# Patient Record
Sex: Male | Born: 1945 | Race: White | Hispanic: No | Marital: Married | Smoking: Never smoker
Health system: Southern US, Community
[De-identification: ages and names within clinical notes are randomized; demographics above are authoritative.]

## PROBLEM LIST (undated history)

## (undated) DIAGNOSIS — I10 Essential (primary) hypertension: Secondary | ICD-10-CM

## (undated) DIAGNOSIS — D689 Coagulation defect, unspecified: Secondary | ICD-10-CM

## (undated) HISTORY — PX: EYE SURGERY: SHX253

## (undated) HISTORY — DX: Coagulation defect, unspecified: D68.9

## (undated) HISTORY — PX: SPINE SURGERY: SHX786

## (undated) HISTORY — DX: Essential (primary) hypertension: I10

## (undated) HISTORY — PX: HERNIA REPAIR: SHX51

## (undated) HISTORY — PX: FRACTURE SURGERY: SHX138

---

## 2020-04-20 ENCOUNTER — Other Ambulatory Visit: Payer: Self-pay

## 2020-04-20 ENCOUNTER — Ambulatory Visit (INDEPENDENT_AMBULATORY_CARE_PROVIDER_SITE_OTHER): Payer: Medicare Other | Admitting: Internal Medicine

## 2020-04-20 ENCOUNTER — Encounter: Payer: Self-pay | Admitting: Internal Medicine

## 2020-04-20 VITALS — BP 138/78 | HR 67 | Temp 98.4°F | Resp 16 | Ht 61.0 in | Wt 274.1 lb

## 2020-04-20 DIAGNOSIS — Z0000 Encounter for general adult medical examination without abnormal findings: Secondary | ICD-10-CM | POA: Insufficient documentation

## 2020-04-20 DIAGNOSIS — Z7689 Persons encountering health services in other specified circumstances: Secondary | ICD-10-CM | POA: Diagnosis not present

## 2020-04-20 DIAGNOSIS — I4891 Unspecified atrial fibrillation: Secondary | ICD-10-CM | POA: Insufficient documentation

## 2020-04-20 DIAGNOSIS — I10 Essential (primary) hypertension: Secondary | ICD-10-CM | POA: Diagnosis not present

## 2020-04-20 DIAGNOSIS — I340 Nonrheumatic mitral (valve) insufficiency: Secondary | ICD-10-CM | POA: Insufficient documentation

## 2020-04-20 DIAGNOSIS — R7303 Prediabetes: Secondary | ICD-10-CM | POA: Diagnosis not present

## 2020-04-20 DIAGNOSIS — E782 Mixed hyperlipidemia: Secondary | ICD-10-CM

## 2020-04-20 DIAGNOSIS — I480 Paroxysmal atrial fibrillation: Secondary | ICD-10-CM

## 2020-04-20 DIAGNOSIS — E785 Hyperlipidemia, unspecified: Secondary | ICD-10-CM | POA: Insufficient documentation

## 2020-04-20 NOTE — Assessment & Plan Note (Signed)
HbA1C: 5.8 (12/2019) Diet-controlled 

## 2020-04-20 NOTE — Assessment & Plan Note (Signed)
Care established Previous chart reviewed History and medications reviewed with the patient 

## 2020-04-20 NOTE — Assessment & Plan Note (Signed)
Mild according to the previous Cardiologist note review Appears euvolemic On B-blocker for A Fib

## 2020-04-20 NOTE — Assessment & Plan Note (Signed)
Rate-controlled On Sotalol and Eliquis Follows up with Cardiologist Has a loop recorder in place

## 2020-04-20 NOTE — Patient Instructions (Addendum)
Please continue to take medications as prescribed.  Please get fasting blood tests done at least 2 days before the next visit.  Please continue to follow DASH diet and perform moderate exercise/walking as tolerated.  Thank you for choosing [facility]. We consider it our privilege to take care of you!

## 2020-04-20 NOTE — Progress Notes (Signed)
New Patient Office Visit  Subjective:  Patient ID: Jeffery Sanders, male    DOB: [birth year]  Age: 74 y.o. MRN: [id number]  CC:  Chief Complaint  Patient presents with  . New Patient (Initial Visit)    New patient sees dr Saralyn Pilar at [facility] blood work is due     HPI Jeffery Sanders is a 74 year old male with PMH of HTN, paroxysmal A Fib and HLD who presents for establishing care. He moved from [place] recently.  Patient has brought his records from previous provider, which is reviewed.  Patient takes Amlodipine and Triamterene-HCTZ for HTN. BP is well-controlled. Takes medications regularly. Patient denies headache, dizziness, chest pain, dyspnea or palpitations.  Patient has a h/o paroxysmal A Fib, and takes Sotalol and Eliquis. He follows up with Cardiologist at [facility]. Patient has a loop recorder, which was placed in [place]. No h/o cardiac ablation.  Patient has h/o diet-controlled prediabetes.  Last colonoscopy in 2021. Has a family history of colon cancer.  Patient is up-to-date with COVID and flu vaccine. Patient has had PCV13 and PPSV23 (2019).    Past Medical History:  Diagnosis Date  . Clotting disorder ([place])    Phreesia 04/18/2020  . Hypertension    Phreesia 04/18/2020    Past Surgical History:  Procedure Laterality Date  . EYE SURGERY N/A    Phreesia 04/18/2020  . FRACTURE SURGERY N/A    Phreesia 04/18/2020  . HERNIA REPAIR N/A    Phreesia 04/18/2020  . SPINE SURGERY N/A    Phreesia 04/18/2020    History reviewed. No pertinent family history.  Social History   Socioeconomic History  . Marital status: Single    Spouse name: Not on file  . Number of children: Not on file  . Years of education: Not on file  . Highest education level: Not on file  Occupational History  . Not on file  Tobacco Use  . Smoking status: Never Smoker  . Smokeless tobacco: Never Used  Substance and Sexual Activity  . Alcohol use: Never  . Drug use: Never   . Sexual activity: Yes  Other Topics Concern  . Not on file  Social History Narrative  . Not on file   Social Determinants of Health   Financial Resource Strain: Not on file  Food Insecurity: Not on file  Transportation Needs: Not on file  Physical Activity: Not on file  Stress: Not on file  Social Connections: Not on file  Intimate Partner Violence: Not on file    ROS Review of Systems  Constitutional: Negative for chills and fever.  HENT: Negative for congestion and sore throat.   Eyes: Negative for pain and discharge.  Respiratory: Negative for cough and shortness of breath.   Cardiovascular: Negative for chest pain and palpitations.  Gastrointestinal: Negative for constipation, diarrhea, nausea and vomiting.  Endocrine: Negative for polydipsia and polyuria.  Genitourinary: Negative for dysuria and hematuria.  Musculoskeletal: Negative for neck pain and neck stiffness.  Skin: Negative for rash.  Neurological: Negative for dizziness, weakness, numbness and headaches.  Psychiatric/Behavioral: Negative for agitation and behavioral problems.    Objective:   Today's Vitals: BP 138/78 (BP Location: Right Arm, Patient Position: Sitting, Cuff Size: Normal)   Pulse 67   Temp 98.4 F (36.9 C) (Oral)   Resp 16   Ht 5' 1" (1.549 m)   Wt 274 lb 1.9 oz (124.3 kg)   SpO2 94%   BMI 51.79 kg/m   Physical Exam Vitals reviewed.  Constitutional:      General: He is not in acute distress.    Appearance: He is obese. He is not diaphoretic.  HENT:     Head: Normocephalic and atraumatic.     Nose: Nose normal.     Mouth/Throat:     Mouth: Mucous membranes are moist.  Eyes:     General: No scleral icterus.    Extraocular Movements: Extraocular movements intact.     Pupils: Pupils are equal, round, and reactive to light.  Cardiovascular:     Rate and Rhythm: Normal rate and regular rhythm.     Pulses: Normal pulses.     Heart sounds: Normal heart sounds. No murmur  heard.   Pulmonary:     Breath sounds: Normal breath sounds. No wheezing or rales.  Abdominal:     Palpations: Abdomen is soft.     Tenderness: There is no abdominal tenderness.  Musculoskeletal:     Cervical back: Neck supple. No tenderness.     Right lower leg: No edema.     Left lower leg: No edema.  Skin:    General: Skin is warm.     Findings: No rash.  Neurological:     General: No focal deficit present.     Mental Status: He is alert and oriented to person, place, and time.     Sensory: No sensory deficit.     Motor: No weakness.  Psychiatric:        Mood and Affect: Mood normal.        Behavior: Behavior normal.     Assessment & Plan:   Problem List Items Addressed This Visit      Cardiovascular and Mediastinum   Atrial fibrillation ([place])    Rate-controlled On Sotalol and Eliquis Follows up with Cardiologist Has a loop recorder in place      Relevant Medications   amLODipine (NORVASC) 5 MG tablet   sotalol (BETAPACE) 120 MG tablet   apixaban (ELIQUIS) 5 MG TABS tablet   triamterene-hydrochlorothiazide (DYAZIDE) 37.5-25 MG capsule   simvastatin (ZOCOR) 20 MG tablet   Hypertension, essential    BP Readings from Last 1 Encounters:  04/20/20 138/78   Well-controlled with Amlodipine and Dyazide Counseled for compliance with the medications Advised DASH diet and moderate exercise/walking, at least 150 mins/week       Relevant Medications   amLODipine (NORVASC) 5 MG tablet   sotalol (BETAPACE) 120 MG tablet   apixaban (ELIQUIS) 5 MG TABS tablet   triamterene-hydrochlorothiazide (DYAZIDE) 37.5-25 MG capsule   simvastatin (ZOCOR) 20 MG tablet   Other Relevant Orders   TSH + free T4   Mitral valve insufficiency    Mild according to the previous Cardiologist note review Appears euvolemic On B-blocker for A Fib      Relevant Medications   amLODipine (NORVASC) 5 MG tablet   sotalol (BETAPACE) 120 MG tablet   apixaban (ELIQUIS) 5 MG TABS tablet    triamterene-hydrochlorothiazide (DYAZIDE) 37.5-25 MG capsule   simvastatin (ZOCOR) 20 MG tablet     Other   Hyperlipemia    On Simvastatin Check lipid profile before next visit      Relevant Medications   amLODipine (NORVASC) 5 MG tablet   sotalol (BETAPACE) 120 MG tablet   apixaban (ELIQUIS) 5 MG TABS tablet   triamterene-hydrochlorothiazide (DYAZIDE) 37.5-25 MG capsule   simvastatin (ZOCOR) 20 MG tablet   Other Relevant Orders   Lipid Profile   Prediabetes    HbA1C: 5.8 (12/2019) Diet-controlled  Relevant Orders   CMP14+EGFR   HgB A1c   Encounter to establish care - Primary    Care established Previous chart reviewed History and medications reviewed with the patient      Relevant Orders   CBC with Differential   TSH + free T4   Vitamin D (25 hydroxy)   Morbid obesity ([place])   Relevant Medications   magnesium oxide (MAG-OX) 400 MG tablet      Outpatient Encounter Medications as of 04/20/2020  Medication Sig  . amLODipine (NORVASC) 5 MG tablet Take by mouth.  Marland Kitchen apixaban (ELIQUIS) 5 MG TABS tablet Take by mouth.  . Coenzyme Q10 100 MG capsule Take by mouth.  . magnesium oxide (MAG-OX) 400 MG tablet Take by mouth.  . simvastatin (ZOCOR) 20 MG tablet Take by mouth.  . sotalol (BETAPACE) 120 MG tablet Take by mouth.  . triamterene-hydrochlorothiazide (DYAZIDE) 37.5-25 MG capsule Take 1 capsule by mouth every morning.   No facility-administered encounter medications on file as of 04/20/2020.    Follow-up: Return in about 4 weeks (around 05/18/2020) for Annual Physical.   Lindell Spar, MD

## 2020-04-20 NOTE — Assessment & Plan Note (Signed)
BP Readings from Last 1 Encounters:  04/20/20 138/78   Well-controlled with Amlodipine and Dyazide Counseled for compliance with the medications Advised DASH diet and moderate exercise/walking, at least 150 mins/week

## 2020-04-20 NOTE — Assessment & Plan Note (Signed)
On Simvastatin Check lipid profile before next visit 

## 2020-05-13 LAB — CMP14+EGFR
ALT: 9 IU/L (ref 0–44)
AST: 16 IU/L (ref 0–40)
Albumin/Globulin Ratio: 1.5 (ref 1.2–2.2)
Albumin: 4.3 g/dL (ref 3.7–4.7)
Alkaline Phosphatase: 68 IU/L (ref 44–121)
BUN/Creatinine Ratio: 19 (ref 10–24)
BUN: 20 mg/dL (ref 8–27)
Bilirubin Total: 0.6 mg/dL (ref 0.0–1.2)
CO2: 24 mmol/L (ref 20–29)
Calcium: 9.2 mg/dL (ref 8.6–10.2)
Chloride: 104 mmol/L (ref 96–106)
Creatinine, Ser: 1.06 mg/dL (ref 0.76–1.27)
GFR calc Af Amer: 80 mL/min/{1.73_m2} (ref >59)
GFR calc non Af Amer: 69 mL/min/{1.73_m2} (ref >59)
Globulin, Total: 2.8 g/dL (ref 1.5–4.5)
Glucose: 109 mg/dL — ABNORMAL HIGH (ref 65–99)
Potassium: 4 mmol/L (ref 3.5–5.2)
Sodium: 141 mmol/L (ref 134–144)
Total Protein: 7.1 g/dL (ref 6.0–8.5)

## 2020-05-13 LAB — CBC WITH DIFFERENTIAL/PLATELET
Basophils Absolute: 0 10*3/uL (ref 0.0–0.2)
Basos: 1 %
EOS (ABSOLUTE): 0.1 10*3/uL (ref 0.0–0.4)
Eos: 2 %
Hematocrit: 42.9 % (ref 37.5–51.0)
Hemoglobin: 14.5 g/dL (ref 13.0–17.7)
Immature Grans (Abs): 0 10*3/uL (ref 0.0–0.1)
Immature Granulocytes: 0 %
Lymphocytes Absolute: 1.7 10*3/uL (ref 0.7–3.1)
Lymphs: 33 %
MCH: 29.5 pg (ref 26.6–33.0)
MCHC: 33.8 g/dL (ref 31.5–35.7)
MCV: 87 fL (ref 79–97)
Monocytes Absolute: 0.7 10*3/uL (ref 0.1–0.9)
Monocytes: 14 %
Neutrophils Absolute: 2.6 10*3/uL (ref 1.4–7.0)
Neutrophils: 50 %
Platelets: 186 10*3/uL (ref 150–450)
RBC: 4.92 x10E6/uL (ref 4.14–5.80)
RDW: 13.5 % (ref 11.6–15.4)
WBC: 5.1 10*3/uL (ref 3.4–10.8)

## 2020-05-13 LAB — LIPID PANEL
Chol/HDL Ratio: 3.4 ratio (ref 0.0–5.0)
Cholesterol, Total: 129 mg/dL (ref 100–199)
HDL: 38 mg/dL — ABNORMAL LOW (ref >39)
LDL Chol Calc (NIH): 68 mg/dL (ref 0–99)
Triglycerides: 127 mg/dL (ref 0–149)
VLDL Cholesterol Cal: 23 mg/dL (ref 5–40)

## 2020-05-13 LAB — HEMOGLOBIN A1C
Est. average glucose Bld gHb Est-mCnc: 126 mg/dL
Hgb A1c MFr Bld: 6 % — ABNORMAL HIGH (ref 4.8–5.6)

## 2020-05-13 LAB — TSH+FREE T4
Free T4: 1.1 ng/dL (ref 0.82–1.77)
TSH: 4.09 u[IU]/mL (ref 0.450–4.500)

## 2020-05-13 LAB — VITAMIN D 25 HYDROXY (VIT D DEFICIENCY, FRACTURES): Vit D, 25-Hydroxy: 27.8 ng/mL — ABNORMAL LOW (ref 30.0–100.0)

## 2020-05-18 ENCOUNTER — Other Ambulatory Visit: Payer: Self-pay

## 2020-05-18 ENCOUNTER — Ambulatory Visit (INDEPENDENT_AMBULATORY_CARE_PROVIDER_SITE_OTHER): Payer: Medicare Other | Admitting: Internal Medicine

## 2020-05-18 ENCOUNTER — Encounter: Payer: Self-pay | Admitting: Internal Medicine

## 2020-05-18 VITALS — BP 125/74 | HR 62 | Temp 97.8°F | Ht 72.0 in | Wt 278.0 lb

## 2020-05-18 DIAGNOSIS — E559 Vitamin D deficiency, unspecified: Secondary | ICD-10-CM | POA: Diagnosis not present

## 2020-05-18 DIAGNOSIS — Z135 Encounter for screening for eye and ear disorders: Secondary | ICD-10-CM | POA: Diagnosis not present

## 2020-05-18 DIAGNOSIS — Z0000 Encounter for general adult medical examination without abnormal findings: Secondary | ICD-10-CM | POA: Diagnosis not present

## 2020-05-18 DIAGNOSIS — L309 Dermatitis, unspecified: Secondary | ICD-10-CM | POA: Diagnosis not present

## 2020-05-18 DIAGNOSIS — I10 Essential (primary) hypertension: Secondary | ICD-10-CM | POA: Diagnosis not present

## 2020-05-18 MED ORDER — TRIAMCINOLONE ACETONIDE 0.1 % EX CREA: 1.0000 "application " | TOPICAL_CREAM | Freq: Two times a day (BID) | CUTANEOUS | 0 refills | Status: DC

## 2020-05-18 NOTE — Assessment & Plan Note (Signed)
Annual exam as documented. Counseling done  re healthy lifestyle involving commitment to 150 minutes exercise per week, heart healthy diet, and attaining healthy weight.The importance of adequate sleep also discussed. Changes in health habits are decided on by the patient with goals and time frames  set for achieving them. Immunization and cancer screening needs are specifically addressed at this visit. 

## 2020-05-18 NOTE — Assessment & Plan Note (Signed)
Advised to take Vitamin 1000 IU QD

## 2020-05-18 NOTE — Assessment & Plan Note (Signed)
Dry skin over palms with patches of erythema Kenalog cream prescribed Advised to apply lotion/moisturizer

## 2020-05-18 NOTE — Patient Instructions (Signed)
Please continue to take medications as prescribed.  Please start taking Vitamin D 1000 IU once in a day.  Please continue to check BP at home and contact us if BP is consistently more than 140/90.  Please continue to follow heart healthy diet and perform moderate exercise/walking at least 150 mins/week.

## 2020-05-18 NOTE — Progress Notes (Addendum)
Established Patient Office Visit  Subjective:  Patient ID: Jeffery Sanders, male    DOB: 1945-09-30  Age: 75 y.o. MRN: [id number]  CC:  Chief Complaint  Patient presents with  . Annual Exam    HPI Jeffery Sanders is a 75 year old male with PMH of HTN, paroxysmal A Fib and HLD who presents for follow up of his chronic medical conditions and c/o scaling over his palm.  He states that he has been in good health overall. His blood tests were reviewed and discussed with him in detail.  He c/o b/l palm area dryness and scaling. He was told of eczema in the past and used to use a cream for it. He also c/o dry skin over the soles. Denies any known environmental allergies.  He feels gloomy during winter season and cloudy days. He denies any anhedonia, anxiety, change in appetite or weight.  Past Medical History:  Diagnosis Date  . Clotting disorder ([facility])    Phreesia 04/18/2020  . Hypertension    Phreesia 04/18/2020    Past Surgical History:  Procedure Laterality Date  . EYE SURGERY N/A    Phreesia 04/18/2020  . FRACTURE SURGERY N/A    Phreesia 04/18/2020  . HERNIA REPAIR N/A    Phreesia 04/18/2020  . SPINE SURGERY N/A    Phreesia 04/18/2020    History reviewed. No pertinent family history.  Social History   Socioeconomic History  . Marital status: Single    Spouse name: Not on file  . Number of children: Not on file  . Years of education: Not on file  . Highest education level: Not on file  Occupational History  . Not on file  Tobacco Use  . Smoking status: Never Smoker  . Smokeless tobacco: Never Used  Substance and Sexual Activity  . Alcohol use: Never  . Drug use: Never  . Sexual activity: Yes  Other Topics Concern  . Not on file  Social History Narrative  . Not on file   Social Determinants of Health   Financial Resource Strain: Not on file  Food Insecurity: Not on file  Transportation Needs: Not on file  Physical Activity: Not on file  Stress: Not on file   Social Connections: Not on file  Intimate Partner Violence: Not on file    Outpatient Medications Prior to Visit  Medication Sig Dispense Refill  . amLODipine (NORVASC) 5 MG tablet Take by mouth.    Marland Kitchen apixaban (ELIQUIS) 5 MG TABS tablet Take by mouth.    . Coenzyme Q10 100 MG capsule Take by mouth.    . magnesium oxide (MAG-OX) 400 MG tablet Take by mouth.    . simvastatin (ZOCOR) 20 MG tablet Take by mouth.    . sotalol (BETAPACE) 120 MG tablet Take by mouth.    . triamterene-hydrochlorothiazide (DYAZIDE) 37.5-25 MG capsule Take 1 capsule by mouth every morning.     No facility-administered medications prior to visit.    Not on File  ROS Review of Systems  Constitutional: Negative for chills and fever.  HENT: Negative for congestion and sore throat.   Eyes: Negative for pain and discharge.  Respiratory: Negative for cough and shortness of breath.   Cardiovascular: Negative for chest pain and palpitations.  Gastrointestinal: Negative for constipation, diarrhea, nausea and vomiting.  Endocrine: Negative for polydipsia and polyuria.  Genitourinary: Negative for dysuria and hematuria.  Musculoskeletal: Negative for neck pain and neck stiffness.  Skin: Positive for rash.  Neurological: Negative for dizziness, weakness, numbness and  headaches.  Psychiatric/Behavioral: Negative for agitation and behavioral problems.      Objective:    Physical Exam Vitals reviewed.  Constitutional:      General: He is not in acute distress.    Appearance: He is obese. He is not diaphoretic.  HENT:     Head: Normocephalic and atraumatic.     Nose: Nose normal.     Mouth/Throat:     Mouth: Mucous membranes are moist.  Eyes:     General: No scleral icterus.    Extraocular Movements: Extraocular movements intact.     Pupils: Pupils are equal, round, and reactive to light.  Cardiovascular:     Rate and Rhythm: Normal rate and regular rhythm.     Pulses: Normal pulses.     Heart sounds:  Normal heart sounds. No murmur heard.   Pulmonary:     Breath sounds: Normal breath sounds. No wheezing or rales.  Abdominal:     Palpations: Abdomen is soft.     Tenderness: There is no abdominal tenderness.  Musculoskeletal:     Cervical back: Neck supple. No tenderness.     Right lower leg: No edema.     Left lower leg: No edema.  Skin:    General: Skin is warm.     Comments: Scaly patches and erythema over b/l hands Plaque over scalp, right occipital area, no drainage or color/border irregularity  Neurological:     General: No focal deficit present.     Mental Status: He is alert and oriented to person, place, and time.     Sensory: No sensory deficit.     Motor: No weakness.  Psychiatric:        Mood and Affect: Mood normal.        Behavior: Behavior normal.     BP 125/74 (BP Location: Left Arm, Patient Position: Sitting)   Pulse 62   Temp 97.8 F (36.6 C) (Oral)   Ht 6' (1.829 m)   Wt 278 lb (126.1 kg)   SpO2 96%   BMI 37.70 kg/m  Wt Readings from Last 3 Encounters:  05/18/20 278 lb (126.1 kg)  04/20/20 274 lb 1.9 oz (124.3 kg)     Health Maintenance Due  Topic Date Due  . Hepatitis C Screening  Never done  . TETANUS/TDAP  Never done    There are no preventive care reminders to display for this patient.  Lab Results  Component Value Date   TSH 4.090 05/12/2020   Lab Results  Component Value Date   WBC 5.1 05/12/2020   HGB 14.5 05/12/2020   HCT 42.9 05/12/2020   MCV 87 05/12/2020   PLT 186 05/12/2020   Lab Results  Component Value Date   NA 141 05/12/2020   K 4.0 05/12/2020   CO2 24 05/12/2020   GLUCOSE 109 (H) 05/12/2020   BUN 20 05/12/2020   CREATININE 1.06 05/12/2020   BILITOT 0.6 05/12/2020   ALKPHOS 68 05/12/2020   AST 16 05/12/2020   ALT 9 05/12/2020   PROT 7.1 05/12/2020   ALBUMIN 4.3 05/12/2020   CALCIUM 9.2 05/12/2020   Lab Results  Component Value Date   CHOL 129 05/12/2020   Lab Results  Component Value Date   HDL 38  (L) 05/12/2020   Lab Results  Component Value Date   LDLCALC 68 05/12/2020   Lab Results  Component Value Date   TRIG 127 05/12/2020   Lab Results  Component Value Date   CHOLHDL 3.4 05/12/2020  Lab Results  Component Value Date   HGBA1C 6.0 (H) 05/12/2020      Assessment & Plan:   Problem List Items Addressed This Visit      Musculoskeletal and Integument  Eczema   Dry skin over palms with patches of erythema Kenalog cream prescribed Advised to apply lotion/moisturizer     Relevant Medications  triamcinolone (KENALOG) 0.1 %  HTN BP Readings from Last 1 Encounters:  05/18/20 125/74   Well-controlled Counseled for compliance with the medications Advised DASH diet and moderate exercise/walking, at least 150 mins/week    Vitamin D deficiency    Advised to take Vitamin 1000 IU QD         Meds ordered this encounter  Medications  . triamcinolone (KENALOG) 0.1 %    Sig: Apply 1 application topically 2 (two) times daily.    Dispense:  30 g    Refill:  0    Follow-up: Return in about 4 months (around 09/15/2020).    Anabel Halon, MD

## 2020-05-31 ENCOUNTER — Other Ambulatory Visit: Payer: Self-pay | Admitting: Internal Medicine

## 2020-05-31 DIAGNOSIS — L309 Dermatitis, unspecified: Secondary | ICD-10-CM

## 2020-06-05 ENCOUNTER — Telehealth: Payer: Self-pay

## 2020-06-05 NOTE — Telephone Encounter (Signed)
Patient was told by Dr Marguerita Beards office that he would be better off seeing a optometrist vs a ophthalmologist can you have Dr Allena Katz put in a new referral and I will send it to the correct place ? Phone [phone]

## 2020-06-15 ENCOUNTER — Other Ambulatory Visit: Payer: Self-pay | Admitting: *Deleted

## 2020-06-15 DIAGNOSIS — R7303 Prediabetes: Secondary | ICD-10-CM

## 2020-06-15 DIAGNOSIS — Z0100 Encounter for examination of eyes and vision without abnormal findings: Secondary | ICD-10-CM

## 2020-06-15 NOTE — Progress Notes (Signed)
amb ref °

## 2020-06-19 ENCOUNTER — Telehealth: Payer: Self-pay

## 2020-06-19 NOTE — Telephone Encounter (Signed)
I thought he had asked for annual physical and that is what we had scheduled. I am okay with changing it to routine follow up code - [id number].

## 2020-06-19 NOTE — Telephone Encounter (Signed)
Patients girlfriend called and said the insurance company told them the service on 04/30/20 should be billed as a AWV and that is why it was not paid.  The patient is asking you to edit and change the code if this was or should have been a AWV. Please let me know if you are able to change the coding and I will have billing re bill it if the visit was a AWV and it was just coded incorrect.  I will call the patient back when we confirm the billing.

## 2020-06-19 NOTE — Addendum Note (Signed)
Addended byTrena Platt on: 06/19/2020 05:00 PM   Modules accepted: Level of Service

## 2020-06-20 ENCOUNTER — Ambulatory Visit (HOSPITAL_COMMUNITY)
Admission: RE | Admit: 2020-06-20 | Discharge: 2020-06-20 | Disposition: A | Discharge status: Home / Self Care | Payer: Medicare Other | Source: Ambulatory Visit | Attending: Internal Medicine | Admitting: Internal Medicine

## 2020-06-20 ENCOUNTER — Encounter: Payer: Self-pay | Admitting: Internal Medicine

## 2020-06-20 ENCOUNTER — Telehealth: Payer: Self-pay

## 2020-06-20 ENCOUNTER — Ambulatory Visit (INDEPENDENT_AMBULATORY_CARE_PROVIDER_SITE_OTHER): Payer: Medicare Other | Admitting: Internal Medicine

## 2020-06-20 ENCOUNTER — Other Ambulatory Visit: Payer: Self-pay

## 2020-06-20 VITALS — BP 135/76 | HR 62 | Temp 97.6°F | Ht 72.0 in | Wt 275.0 lb

## 2020-06-20 DIAGNOSIS — Z95818 Presence of other cardiac implants and grafts: Secondary | ICD-10-CM | POA: Insufficient documentation

## 2020-06-20 DIAGNOSIS — R0781 Pleurodynia: Secondary | ICD-10-CM | POA: Insufficient documentation

## 2020-06-20 DIAGNOSIS — W19XXXA Unspecified fall, initial encounter: Secondary | ICD-10-CM | POA: Insufficient documentation

## 2020-06-20 DIAGNOSIS — J8489 Other specified interstitial pulmonary diseases: Secondary | ICD-10-CM | POA: Diagnosis not present

## 2020-06-20 NOTE — Telephone Encounter (Signed)
Dr Allena Katz corrected the medical record to reflect 13887 for DOS 05/18/20.  I have emailed Melissa Noon and asked for this claim to rebill if possible.  I called patient and let them know we were asking for a refilled claim.

## 2020-06-20 NOTE — Progress Notes (Signed)
Acute Office Visit  Subjective:    Patient ID: Jeffery Sanders, male    DOB: [birth year], 75 y.o.   MRN: [id number]  Chief Complaint  Patient presents with  . Genia Hotter into a bench outside yesterday, hurt his R abdomen/rib area. Hurts to take a deep breath, cough, sneeze, walk.    HPI Patient is in today for evaluation after a mechanical fall yesterday. He was walking on sidewalk and tripped over a pine cone. He tried to grab a bench, but fell over his right side and hit his right rib cage. He is having constant dull ache of right chest wall, but there is no bruising noted. He tried heating pads and Tylenol, which have helped. Pain is worse with deep breathing and cough. Of note, he is on Eliquis for A Fib.  Past Medical History:  Diagnosis Date  . Clotting disorder (HCC)    [facility] 04/18/2020  . Hypertension    [facility] 04/18/2020    Past Surgical History:  Procedure Laterality Date  . EYE SURGERY N/A    [facility] 04/18/2020  . FRACTURE SURGERY N/A    [facility] 04/18/2020  . HERNIA REPAIR N/A    [facility] 04/18/2020  . SPINE SURGERY N/A    [facility] 04/18/2020    History reviewed. No pertinent family history.  Social History   Socioeconomic History  . Marital status: Single    Spouse name: Not on file  . Number of children: Not on file  . Years of education: Not on file  . Highest education level: Not on file  Occupational History  . Not on file  Tobacco Use  . Smoking status: Never Smoker  . Smokeless tobacco: Never Used  Substance and Sexual Activity  . Alcohol use: Never  . Drug use: Never  . Sexual activity: Yes  Other Topics Concern  . Not on file  Social History Narrative  . Not on file   Social Determinants of Health   Financial Resource Strain: Not on file  Food Insecurity: Not on file  Transportation Needs: Not on file  Physical Activity: Not on file  Stress: Not on file  Social Connections: Not on file  Intimate Partner Violence: Not on file     Outpatient Medications Prior to Visit  Medication Sig Dispense Refill  . amLODipine (NORVASC) 5 MG tablet Take by mouth.    Marland Kitchen apixaban (ELIQUIS) 5 MG TABS tablet Take by mouth.    . Coenzyme Q10 100 MG capsule Take by mouth.    . magnesium oxide (MAG-OX) 400 MG tablet Take by mouth.    . simvastatin (ZOCOR) 20 MG tablet Take by mouth.    . sotalol (BETAPACE) 120 MG tablet Take by mouth.    . triamcinolone (KENALOG) 0.1 % APPLY TOPICALLY TO THE AFFECTED AREA TWICE DAILY 30 g 0  . triamterene-hydrochlorothiazide (DYAZIDE) 37.5-25 MG capsule Take 1 capsule by mouth every morning.     No facility-administered medications prior to visit.    Not on File  Review of Systems  Constitutional: Negative for chills and fever.  HENT: Negative for congestion and sore throat.   Eyes: Negative for pain and discharge.  Respiratory: Negative for cough and shortness of breath.   Cardiovascular: Positive for chest pain. Negative for palpitations.  Gastrointestinal: Negative for constipation, diarrhea, nausea and vomiting.  Endocrine: Negative for polydipsia and polyuria.  Genitourinary: Negative for dysuria and hematuria.  Musculoskeletal: Negative for neck pain and neck stiffness.  Skin: Negative for  rash.  Neurological: Negative for dizziness, weakness, numbness and headaches.  Psychiatric/Behavioral: Negative for agitation and behavioral problems.       Objective:    Physical Exam Vitals reviewed.  Constitutional:      General: He is not in acute distress.    Appearance: He is obese. He is not diaphoretic.  HENT:     Head: Normocephalic and atraumatic.     Nose: Nose normal.     Mouth/Throat:     Mouth: Mucous membranes are moist.  Eyes:     General: No scleral icterus.    Extraocular Movements: Extraocular movements intact.     Pupils: Pupils are equal, round, and reactive to light.  Cardiovascular:     Rate and Rhythm: Normal rate and regular rhythm.     Pulses: Normal pulses.      Heart sounds: Normal heart sounds. No murmur heard.   Pulmonary:     Breath sounds: Normal breath sounds. No wheezing or rales.  Abdominal:     Palpations: Abdomen is soft.     Tenderness: There is no abdominal tenderness.  Musculoskeletal:     Cervical back: Neck supple. No tenderness.     Right lower leg: No edema.     Left lower leg: No edema.  Skin:    General: Skin is warm.     Comments: Scaly patches and erythema over b/l hands Plaque over scalp, right occipital area, no drainage or color/border irregularity  Neurological:     General: No focal deficit present.     Mental Status: He is alert and oriented to person, place, and time.     Sensory: No sensory deficit.     Motor: No weakness.  Psychiatric:        Mood and Affect: Mood normal.        Behavior: Behavior normal.     BP 135/76 (BP Location: Left Arm, Patient Position: Sitting, Cuff Size: Normal)   Pulse 62   Temp 97.6 F (36.4 C) (Temporal)   Ht 6' (1.829 m)   Wt 275 lb (124.7 kg)   SpO2 93%   BMI 37.30 kg/m  Wt Readings from Last 3 Encounters:  06/20/20 275 lb (124.7 kg)  05/18/20 278 lb (126.1 kg)  04/20/20 274 lb 1.9 oz (124.3 kg)    Health Maintenance Due  Topic Date Due  . Hepatitis C Screening  Never done  . TETANUS/TDAP  Never done    There are no preventive care reminders to display for this patient.   Lab Results  Component Value Date   TSH 4.090 05/12/2020   Lab Results  Component Value Date   WBC 5.1 05/12/2020   HGB 14.5 05/12/2020   HCT 42.9 05/12/2020   MCV 87 05/12/2020   PLT 186 05/12/2020   Lab Results  Component Value Date   NA 141 05/12/2020   K 4.0 05/12/2020   CO2 24 05/12/2020   GLUCOSE 109 (H) 05/12/2020   BUN 20 05/12/2020   CREATININE 1.06 05/12/2020   BILITOT 0.6 05/12/2020   ALKPHOS 68 05/12/2020   AST 16 05/12/2020   ALT 9 05/12/2020   PROT 7.1 05/12/2020   ALBUMIN 4.3 05/12/2020   CALCIUM 9.2 05/12/2020   Lab Results  Component Value Date    CHOL 129 05/12/2020   Lab Results  Component Value Date   HDL 38 (L) 05/12/2020   Lab Results  Component Value Date   LDLCALC 68 05/12/2020   Lab Results  Component Value  Date   TRIG 127 05/12/2020   Lab Results  Component Value Date   CHOLHDL 3.4 05/12/2020   Lab Results  Component Value Date   HGBA1C 6.0 (H) 05/12/2020       Assessment & Plan:   Problem List Items Addressed This Visit   None   Visit Diagnoses    Fall, initial encounter    -  Primary Mechanical fall Check X-ray ribs to r/o rib fracture   Relevant Orders   DG Ribs Unilateral Right   Rib pain on right side     Tylenol PRN Heating pads Deep breathing as tolerated   Relevant Orders   DG Ribs Unilateral Right       No orders of the defined types were placed in this encounter.    Anabel Halon, MD

## 2020-06-20 NOTE — Patient Instructions (Signed)
Chest Wall Pain Chest wall pain is pain in or around the bones and muscles of your chest. Chest wall pain may be caused by:  An injury.  Coughing a lot.  Using your chest and arm muscles too much. Sometimes, the cause may not be known. This pain may take a few weeks or longer to get better. Follow these instructions at home: Managing pain, stiffness, and swelling If told, put ice on the painful area:  Put ice in a plastic bag.  Place a towel between your skin and the bag.  Leave the ice on for 20 minutes, 2-3 times a day.   Activity  Rest as told by your doctor.  Avoid doing things that cause pain. This includes lifting heavy items.  Ask your doctor what activities are safe for you. General instructions  Take over-the-counter and prescription medicines only as told by your doctor.  Do not use any products that contain nicotine or tobacco, such as cigarettes, e-cigarettes, and chewing tobacco. If you need help quitting, ask your doctor.  Keep all follow-up visits as told by your doctor. This is important.   Contact a doctor if:  You have a fever.  Your chest pain gets worse.  You have new symptoms. Get help right away if:  You feel sick to your stomach (nauseous) or you throw up (vomit).  You feel sweaty or light-headed.  You have a cough with mucus from your lungs (sputum) or you cough up blood.  You are Tesch of breath. These symptoms may be an emergency. Do not wait to see if the symptoms will go away. Get medical help right away. Call your local emergency services (911 in the [place] Do not drive yourself to the hospital. Summary  Chest wall pain is pain in or around the bones and muscles of your chest.  It may be treated with ice, rest, and medicines. Your condition may also get better if you avoid doing things that cause pain.  Contact a doctor if you have a fever, chest pain that gets worse, or new symptoms.  Get help right away if you feel light-headed  or you get Ridlon of breath. These symptoms may be an emergency. This information is not intended to replace advice given to you by your health care provider. Make sure you discuss any questions you have with your health care provider. Document Revised: 10/16/2017 Document Reviewed: 10/16/2017 Elsevier Patient Education  2021 Elsevier Inc.  

## 2020-06-26 ENCOUNTER — Other Ambulatory Visit: Payer: Self-pay | Admitting: Internal Medicine

## 2020-06-26 DIAGNOSIS — L309 Dermatitis, unspecified: Secondary | ICD-10-CM

## 2020-08-15 NOTE — Progress Notes (Signed)
Subjective:   Jeffery Sanders is a 75 y.o. male who presents for Medicare Annual/Subsequent preventive examination.  I connected with [name]  today by telephone and verified that I am speaking with the correct person using two identifiers. Location patient: home Location provider: work Persons participating in the virtual visit: patient, provider.   I discussed the limitations, risks, security and privacy concerns of performing an evaluation and management service by telephone and the availability of in person appointments. I also discussed with the patient that there may be a patient responsible charge related to this service. The patient expressed understanding and verbally consented to this telephonic visit.    Interactive audio and video telecommunications were attempted between this provider and patient, however failed, due to patient having technical difficulties OR patient did not have access to video capability.  We continued and completed visit with audio only.      Review of Systems    N/A  Cardiac Risk Factors include: advanced age (>66men, >31 women);male gender;hypertension;dyslipidemia;smoking/ tobacco exposure     Objective:    Today's Vitals   There is no height or weight on file to calculate BMI.  Advanced Directives 08/16/2020  Does Patient Have a Medical Advance Directive? No  Would patient like information on creating a medical advance directive? No - Patient declined    Current Medications (verified) Outpatient Encounter Medications as of 08/16/2020  Medication Sig  . amLODipine (NORVASC) 5 MG tablet Take by mouth.  Marland Kitchen apixaban (ELIQUIS) 5 MG TABS tablet Take by mouth.  . Coenzyme Q10 100 MG capsule Take by mouth.  . magnesium oxide (MAG-OX) 400 MG tablet Take by mouth.  . simvastatin (ZOCOR) 20 MG tablet Take by mouth.  . sotalol (BETAPACE) 120 MG tablet Take by mouth.  . triamcinolone (KENALOG) 0.1 % APPLY TOPICALLY TO THE AFFECTED AREA TWICE DAILY   . triamterene-hydrochlorothiazide (DYAZIDE) 37.5-25 MG capsule Take 1 capsule by mouth every morning.   No facility-administered encounter medications on file as of 08/16/2020.    Allergies (verified) Patient has no known allergies.   History: Past Medical History:  Diagnosis Date  . Clotting disorder (HCC)    [facility] 04/18/2020  . Hypertension    [facility] 04/18/2020   Past Surgical History:  Procedure Laterality Date  . EYE SURGERY N/A    [facility] 04/18/2020  . FRACTURE SURGERY N/A    [facility] 04/18/2020  . HERNIA REPAIR N/A    [facility] 04/18/2020  . SPINE SURGERY N/A    [facility] 04/18/2020   History reviewed. No pertinent family history. Social History   Socioeconomic History  . Marital status: Single    Spouse name: Not on file  . Number of children: Not on file  . Years of education: Not on file  . Highest education level: Not on file  Occupational History  . Not on file  Tobacco Use  . Smoking status: Never Smoker  . Smokeless tobacco: Never Used  Substance and Sexual Activity  . Alcohol use: Never  . Drug use: Never  . Sexual activity: Yes  Other Topics Concern  . Not on file  Social History Narrative  . Not on file   Social Determinants of Health   Financial Resource Strain: Low Risk   . Difficulty of Paying Living Expenses: Not hard at all  Food Insecurity: No Food Insecurity  . Worried About Programme researcher, broadcasting/film/video in the Last Year: Never true  . Ran Out of Food in the Last Year: Never true  Transportation Needs: No Transportation Needs  . Lack of Transportation (Medical): No  . Lack of Transportation (Non-Medical): No  Physical Activity: Insufficiently Active  . Days of Exercise per Week: 7 days  . Minutes of Exercise per Session: 20 min  Stress: No Stress Concern Present  . Feeling of Stress : Not at all  Social Connections: Moderately Isolated  . Frequency of Communication with Friends and Family: Once a week  . Frequency of Social  Gatherings with Friends and Family: Once a week  . Attends Religious Services: 1 to 4 times per year  . Active Member of Clubs or Organizations: No  . Attends Banker Meetings: Never  . Marital Status: Living with partner    Tobacco Counseling Counseling given: Not Answered   Clinical Intake:  Pre-visit preparation completed: Yes  Pain : No/denies pain     Nutritional Risks: None Diabetes: No  How often do you need to have someone help you when you read instructions, pamphlets, or other written materials from your doctor or pharmacy?: 2 - Rarely  Diabetic?No   Interpreter Needed?: No  Information entered by :: SCrews,LPN   Activities of Daily Living In your present state of health, do you have any difficulty performing the following activities: 08/16/2020 04/20/2020  Hearing? Y N  Comment has some difficulty hearing to right ear. Has hx of mastoid surgery -  Vision? N N  Difficulty concentrating or making decisions? N N  Walking or climbing stairs? N N  Dressing or bathing? N N  Doing errands, shopping? N N  Preparing Food and eating ? N -  Using the Toilet? N -  In the past six months, have you accidently leaked urine? N -  Do you have problems with loss of bowel control? N -  Managing your Medications? N -  Managing your Finances? N -  Housekeeping or managing your Housekeeping? N -  Some recent data might be hidden    Patient Care Team: Anabel Halon, MD as PCP - General (Internal Medicine)  Indicate any recent Medical Services you may have received from other than [facility] providers in the past year (date may be approximate).     Assessment:   This is a routine wellness examination for [name].  Hearing/Vision screen  Hearing Screening   125Hz  250Hz  500Hz  1000Hz  2000Hz  3000Hz  4000Hz  6000Hz  8000Hz   Right ear:           Left ear:           Vision Screening Comments: Gets eye examined once per year. Has upcoming appointment in June. Has hx of  bilateral cataract surgery. Currently wears reading glasses.   Dietary issues and exercise activities discussed: Current Exercise Habits: Home exercise routine, Type of exercise: walking, Time (Minutes): 25, Frequency (Times/Week): 7, Weekly Exercise (Minutes/Week): 175, Intensity: Mild, Exercise limited by: None identified  Goals    . Weight (lb) < 225 lb (102.1 kg)      Depression Screen PHQ 2/9 Scores 08/16/2020 06/20/2020 05/18/2020 04/20/2020  PHQ - 2 Score 0 0 0 0    Fall Risk Fall Risk  08/16/2020 06/20/2020 05/18/2020 04/20/2020  Falls in the past year? 1 1 0 0  Number falls in past yr: 0 0 0 0  Injury with Fall? 1 1 0 0  Risk for fall due to : History of fall(s) Impaired balance/gait No Fall Risks Impaired balance/gait  Follow up Falls evaluation completed;Falls prevention discussed Falls evaluation completed Falls evaluation completed Falls evaluation  completed    FALL RISK PREVENTION PERTAINING TO THE HOME:  Any stairs in or around the home? Yes  If so, are there any without handrails? No  Home free of loose throw rugs in walkways, pet beds, electrical cords, etc? Yes  Adequate lighting in your home to reduce risk of falls? Yes   ASSISTIVE DEVICES UTILIZED TO PREVENT FALLS:  Life alert? No  Use of a cane, walker or w/c? No  Grab bars in the bathroom? No  Shower chair or bench in shower? Yes  Elevated toilet seat or a handicapped toilet? No     Cognitive Function:     Normal cognitive status assessed by direct observation by this Nurse Health Advisor. No abnormalities found.      Immunizations Immunization History  Administered Date(s) Administered  . Influenza, Seasonal, Injecte, Preservative Fre 03/22/2015  . Influenza,inj,Quad PF,6+ Mos 02/26/2016, 01/27/2017  . Influenza-Unspecified 03/13/2001, 03/05/2004, 02/25/2005, 03/05/2007, 02/11/2008, 02/10/2009, 01/27/2010, 01/28/2011, 01/28/2012, 02/13/2013, 03/29/2013, 01/27/2020  . Moderna Sars-Covid-2 Vaccination  05/14/2019, 06/12/2019, 02/16/2020  . Pneumococcal Conjugate-13 07/26/2016, 08/30/2016  . Pneumococcal Polysaccharide-23 09/01/2017  . Pneumococcal-Unspecified 08/12/2012  . Td 10/07/2007    TDAP status: Due, Education has been provided regarding the importance of this vaccine. Advised may receive this vaccine at local pharmacy or Health Dept. Aware to provide a copy of the vaccination record if obtained from local pharmacy or Health Dept. Verbalized acceptance and understanding.  Flu Vaccine status: Up to date  Pneumococcal vaccine status: Up to date  Covid-19 vaccine status: Completed vaccines  Qualifies for Shingles Vaccine? Yes   Zostavax completed No   Shingrix Completed?: No.    Education has been provided regarding the importance of this vaccine. Patient has been advised to call insurance company to determine out of pocket expense if they have not yet received this vaccine. Advised may also receive vaccine at local pharmacy or Health Dept. Verbalized acceptance and understanding.  Screening Tests Health Maintenance  Topic Date Due  . Hepatitis C Screening  Never done  . TETANUS/TDAP  10/06/2017  . INFLUENZA VACCINE  11/27/2020  . COLONOSCOPY (Pts [phone] Insurance coverage will need to be confirmed)  09/27/2029  . COVID-19 Vaccine  Completed  . PNA vac Low Risk Adult  Completed  . HPV VACCINES  Aged Out    Health Maintenance  Health Maintenance Due  Topic Date Due  . Hepatitis C Screening  Never done  . TETANUS/TDAP  10/06/2017    Colorectal cancer screening: Type of screening: Colonoscopy. Completed 09/28/2019. Repeat every 10 years  Lung Cancer Screening: (Low Dose CT Chest recommended if Age 51-80 years, 30 pack-year currently smoking OR have quit w/in 15years.) does not qualify.   Lung Cancer Screening Referral: N/A   Additional Screening:  Hepatitis C Screening: does qualify;   Vision Screening: Recommended annual ophthalmology exams for early detection  of glaucoma and other disorders of the eye. Is the patient up to date with their annual eye exam?  Yes  Who is the provider or what is the name of the office in which the patient attends annual eye exams? Dr. Fredrich BirksJon Scott If pt is not established with a provider, would they like to be referred to a provider to establish care? No .   Dental Screening: Recommended annual dental exams for proper oral hygiene  Community Resource Referral / Chronic Care Management: CRR required this visit?  No   CCM required this visit?  No      Plan:  I have personally reviewed and noted the following in the patient's chart:   . Medical and social history . Use of alcohol, tobacco or illicit drugs  . Current medications and supplements . Functional ability and status . Nutritional status . Physical activity . Advanced directives . List of other physicians . Hospitalizations, surgeries, and ER visits in previous 12 months . Vitals . Screenings to include cognitive, depression, and falls . Referrals and appointments  In addition, I have reviewed and discussed with patient certain preventive protocols, quality metrics, and best practice recommendations. A written personalized care plan for preventive services as well as general preventive health recommendations were provided to patient.     Theodora Blow, LPN   05/23/5807   Nurse Notes: None

## 2020-08-16 ENCOUNTER — Other Ambulatory Visit: Payer: Self-pay

## 2020-08-16 ENCOUNTER — Ambulatory Visit (INDEPENDENT_AMBULATORY_CARE_PROVIDER_SITE_OTHER): Payer: Medicare Other

## 2020-08-16 DIAGNOSIS — Z0000 Encounter for general adult medical examination without abnormal findings: Secondary | ICD-10-CM | POA: Diagnosis not present

## 2020-08-16 NOTE — Patient Instructions (Signed)
Jeffery Sanders , Thank you for taking time to come for your Medicare Wellness Visit. I appreciate your ongoing commitment to your health goals. Please review the following plan we discussed and let me know if I can assist you in the future.   Screening recommendations/referrals: Colonoscopy: Up to date, next due 09/27/2029 Recommended yearly ophthalmology/optometry visit for glaucoma screening and checkup Recommended yearly dental visit for hygiene and checkup  Vaccinations: Influenza vaccine: Up to date, next due fall 2022  Pneumococcal vaccine: Completed series  Tdap vaccine: Currently due, you may await and injury to receive or get at your local pharmacy  Shingles vaccine: Currently due for Shingrix, if you would like to receive we recommend that you do so at your local pharmacy.    Advanced directives: Advance directive discussed with you today. Even though you declined this today please call our office should you change your mind and we can give you the proper paperwork for you to fill out.   Conditions/risks identified: None   Next appointment: 09/21/2020 @ 2:00 PM with Dr. Allena Katz   Preventive Care 74 Years and Older, Male Preventive care refers to lifestyle choices and visits with your health care provider that can promote health and wellness. What does preventive care include?  A yearly physical exam. This is also called an annual well check.  Dental exams once or twice a year.  Routine eye exams. Ask your health care provider how often you should have your eyes checked.  Personal lifestyle choices, including:  Daily care of your teeth and gums.  Regular physical activity.  Eating a healthy diet.  Avoiding tobacco and drug use.  Limiting alcohol use.  Practicing safe sex.  Taking low doses of aspirin every day.  Taking vitamin and mineral supplements as recommended by your health care provider. What happens during an annual well check? The services and screenings  done by your health care provider during your annual well check will depend on your age, overall health, lifestyle risk factors, and family history of disease. Counseling  Your health care provider may ask you questions about your:  Alcohol use.  Tobacco use.  Drug use.  Emotional well-being.  Home and relationship well-being.  Sexual activity.  Eating habits.  History of falls.  Memory and ability to understand (cognition).  Work and work Astronomer. Screening  You may have the following tests or measurements:  Height, weight, and BMI.  Blood pressure.  Lipid and cholesterol levels. These may be checked every 5 years, or more frequently if you are over 59 years old.  Skin check.  Lung cancer screening. You may have this screening every year starting at age 73 if you have a 30-pack-year history of smoking and currently smoke or have quit within the past 15 years.  Fecal occult blood test (FOBT) of the stool. You may have this test every year starting at age 93.  Flexible sigmoidoscopy or colonoscopy. You may have a sigmoidoscopy every 5 years or a colonoscopy every 10 years starting at age 79.  Prostate cancer screening. Recommendations will vary depending on your family history and other risks.  Hepatitis C blood test.  Hepatitis B blood test.  Sexually transmitted disease (STD) testing.  Diabetes screening. This is done by checking your blood sugar (glucose) after you have not eaten for a while (fasting). You may have this done every 1-3 years.  Abdominal aortic aneurysm (AAA) screening. You may need this if you are a current or former smoker.  Osteoporosis.  You may be screened starting at age 15 if you are at high risk. Talk with your health care provider about your test results, treatment options, and if necessary, the need for more tests. Vaccines  Your health care provider may recommend certain vaccines, such as:  Influenza vaccine. This is recommended  every year.  Tetanus, diphtheria, and acellular pertussis (Tdap, Td) vaccine. You may need a Td booster every 10 years.  Zoster vaccine. You may need this after age 23.  Pneumococcal 13-valent conjugate (PCV13) vaccine. One dose is recommended after age 26.  Pneumococcal polysaccharide (PPSV23) vaccine. One dose is recommended after age 52. Talk to your health care provider about which screenings and vaccines you need and how often you need them. This information is not intended to replace advice given to you by your health care provider. Make sure you discuss any questions you have with your health care provider. Document Released: 05/12/2015 Document Revised: 01/03/2016 Document Reviewed: 02/14/2015 Elsevier Interactive Patient Education  2017 [employer].  Fall Prevention in the Home Falls can cause injuries. They can happen to people of all ages. There are many things you can do to make your home safe and to help prevent falls. What can I do on the outside of my home?  Regularly fix the edges of walkways and driveways and fix any cracks.  Remove anything that might make you trip as you walk through a door, such as a raised step or threshold.  Trim any bushes or trees on the path to your home.  Use bright outdoor lighting.  Clear any walking paths of anything that might make someone trip, such as rocks or tools.  Regularly check to see if handrails are loose or broken. Make sure that both sides of any steps have handrails.  Any raised decks and porches should have guardrails on the edges.  Have any leaves, snow, or ice cleared regularly.  Use sand or salt on walking paths during winter.  Clean up any spills in your garage right away. This includes oil or grease spills. What can I do in the bathroom?  Use night lights.  Install grab bars by the toilet and in the tub and shower. Do not use towel bars as grab bars.  Use non-skid mats or decals in the tub or shower.  If  you need to sit down in the shower, use a plastic, non-slip stool.  Keep the floor dry. Clean up any water that spills on the floor as soon as it happens.  Remove soap buildup in the tub or shower regularly.  Attach bath mats securely with double-sided non-slip rug tape.  Do not have throw rugs and other things on the floor that can make you trip. What can I do in the bedroom?  Use night lights.  Make sure that you have a light by your bed that is easy to reach.  Do not use any sheets or blankets that are too big for your bed. They should not hang down onto the floor.  Have a firm chair that has side arms. You can use this for support while you get dressed.  Do not have throw rugs and other things on the floor that can make you trip. What can I do in the kitchen?  Clean up any spills right away.  Avoid walking on wet floors.  Keep items that you use a lot in easy-to-reach places.  If you need to reach something above you, use a strong step stool  that has a grab bar.  Keep electrical cords out of the way.  Do not use floor polish or wax that makes floors slippery. If you must use wax, use non-skid floor wax.  Do not have throw rugs and other things on the floor that can make you trip. What can I do with my stairs?  Do not leave any items on the stairs.  Make sure that there are handrails on both sides of the stairs and use them. Fix handrails that are broken or loose. Make sure that handrails are as long as the stairways.  Check any carpeting to make sure that it is firmly attached to the stairs. Fix any carpet that is loose or worn.  Avoid having throw rugs at the top or bottom of the stairs. If you do have throw rugs, attach them to the floor with carpet tape.  Make sure that you have a light switch at the top of the stairs and the bottom of the stairs. If you do not have them, ask someone to add them for you. What else can I do to help prevent falls?  Wear shoes  that:  Do not have high heels.  Have rubber bottoms.  Are comfortable and fit you well.  Are closed at the toe. Do not wear sandals.  If you use a stepladder:  Make sure that it is fully opened. Do not climb a closed stepladder.  Make sure that both sides of the stepladder are locked into place.  Ask someone to hold it for you, if possible.  Clearly mark and make sure that you can see:  Any grab bars or handrails.  First and last steps.  Where the edge of each step is.  Use tools that help you move around (mobility aids) if they are needed. These include:  Canes.  Walkers.  Scooters.  Crutches.  Turn on the lights when you go into a dark area. Replace any light bulbs as soon as they burn out.  Set up your furniture so you have a clear path. Avoid moving your furniture around.  If any of your floors are uneven, fix them.  If there are any pets around you, be aware of where they are.  Review your medicines with your doctor. Some medicines can make you feel dizzy. This can increase your chance of falling. Ask your doctor what other things that you can do to help prevent falls. This information is not intended to replace advice given to you by your health care provider. Make sure you discuss any questions you have with your health care provider. Document Released: 02/09/2009 Document Revised: 09/21/2015 Document Reviewed: 05/20/2014 Elsevier Interactive Patient Education  2017 [employer].

## 2020-09-21 ENCOUNTER — Encounter: Payer: Self-pay | Admitting: Internal Medicine

## 2020-09-21 ENCOUNTER — Other Ambulatory Visit: Payer: Self-pay

## 2020-09-21 ENCOUNTER — Ambulatory Visit (INDEPENDENT_AMBULATORY_CARE_PROVIDER_SITE_OTHER): Payer: Medicare Other | Admitting: Internal Medicine

## 2020-09-21 VITALS — BP 121/71 | HR 56 | Temp 98.1°F | Resp 16 | Ht 72.0 in | Wt 277.1 lb

## 2020-09-21 DIAGNOSIS — I10 Essential (primary) hypertension: Secondary | ICD-10-CM

## 2020-09-21 DIAGNOSIS — K529 Noninfective gastroenteritis and colitis, unspecified: Secondary | ICD-10-CM

## 2020-09-21 DIAGNOSIS — I480 Paroxysmal atrial fibrillation: Secondary | ICD-10-CM | POA: Diagnosis not present

## 2020-09-21 DIAGNOSIS — K5730 Diverticulosis of large intestine without perforation or abscess without bleeding: Secondary | ICD-10-CM | POA: Insufficient documentation

## 2020-09-21 DIAGNOSIS — Z1159 Encounter for screening for other viral diseases: Secondary | ICD-10-CM | POA: Diagnosis not present

## 2020-09-21 DIAGNOSIS — F411 Generalized anxiety disorder: Secondary | ICD-10-CM | POA: Insufficient documentation

## 2020-09-21 DIAGNOSIS — N433 Hydrocele, unspecified: Secondary | ICD-10-CM | POA: Insufficient documentation

## 2020-09-21 DIAGNOSIS — E782 Mixed hyperlipidemia: Secondary | ICD-10-CM

## 2020-09-21 DIAGNOSIS — Z125 Encounter for screening for malignant neoplasm of prostate: Secondary | ICD-10-CM

## 2020-09-21 DIAGNOSIS — Z87891 Personal history of nicotine dependence: Secondary | ICD-10-CM | POA: Insufficient documentation

## 2020-09-21 DIAGNOSIS — M1990 Unspecified osteoarthritis, unspecified site: Secondary | ICD-10-CM | POA: Insufficient documentation

## 2020-09-21 DIAGNOSIS — I8390 Asymptomatic varicose veins of unspecified lower extremity: Secondary | ICD-10-CM | POA: Insufficient documentation

## 2020-09-21 DIAGNOSIS — H4010X0 Unspecified open-angle glaucoma, stage unspecified: Secondary | ICD-10-CM | POA: Insufficient documentation

## 2020-09-21 HISTORY — DX: Diverticulosis of large intestine without perforation or abscess without bleeding: K57.30

## 2020-09-21 NOTE — Assessment & Plan Note (Signed)
BP Readings from Last 1 Encounters:  09/21/20 121/71   Well-controlled with Amlodipine and Dyazide Counseled for compliance with the medications Advised DASH diet and moderate exercise/walking, at least 150 mins/week

## 2020-09-21 NOTE — Patient Instructions (Signed)
Please continue to follow DASH diet and perform moderate exercise/walking at least 150 mins/week.  Please continue to take medications as prescribed.  Please get fasting blood tests done before the next visit.

## 2020-09-21 NOTE — Progress Notes (Signed)
Established Patient Office Visit  Subjective:  Patient ID: Jeffery Sanders, male    DOB: 1946-02-11  Age: 75 y.o. MRN: [id number]  CC:  Chief Complaint  Patient presents with  . Follow-up    4 month follow up had some recalled peanut butter and the week of May 13 had diarrhea lost 10lbs has been better since also toes have been feeling funny on both feet for awhile     HPI Jeffery Sanders is a 75 year old male with PMH of HTN, paroxysmal A Fib and HLD who presents for follow up of his chronic medical conditions.  HTN: Patient takes Amlodipine and Triamterene-HCTZ for HTN. BP is well-controlled. Takes medications regularly. Patient denies headache, dizziness, chest pain, dyspnea or palpitations.  Patient has a h/o paroxysmal A Fib, and takes Sotalol and Eliquis. He follows up with Cardiologist at [facility]. Patient has a loop recorder, which was placed in [place]. No h/o cardiac ablation.  Patient has h/o diet-controlled prediabetes. He reports funny sensation near the toes over palmar aspect of foot. He has had Podiatry evaluation in the past, and was told that he has chronic thickening of the skin and dry skin.  He had an episode of gastroenteritis about 2 weeks ago, which lasted for 4 days. He had watery diarrhea at that time. He attributes it to recalled peanut butter intake, which had possible salmonella contamination. He reports about 10 lbs weight loss dur to diarrhea.    Past Medical History:  Diagnosis Date  . Clotting disorder ([place])    [employer]rees[employer] 04/18/2020  . Diverticulosis of colon 09/21/2020  . Hypertension    [employer] 04/18/2020    Past Surgical History:  Procedure Laterality Date  . EYE SURGERY N/A    [facility] 04/18/2020  . FRACTURE SURGERY N/A    [facility] 04/18/2020  . HERNIA REPAIR N/A    [facility] 04/18/2020  . SPINE SURGERY N/A    [facility] 04/18/2020    History reviewed. No pertinent family history.  Social History   Socioeconomic History  . Marital status:  Single    Spouse name: Not on file  . Number of children: Not on file  . Years of education: Not on file  . Highest education level: Not on file  Occupational History  . Not on file  Tobacco Use  . Smoking status: Never Smoker  . Smokeless tobacco: Never Used  Substance and Sexual Activity  . Alcohol use: Never  . Drug use: Never  . Sexual activity: Yes  Other Topics Concern  . Not on file  Social History Narrative  . Not on file   Social Determinants of Health   Financial Resource Strain: Low Risk   . Difficulty of Paying Living Expenses: Not hard at all  Food Insecurity: No Food Insecurity  . Worried About Charity fundraiser in the Last Year: Never true  . Ran Out of Food in the Last Year: Never true  Transportation Needs: No Transportation Needs  . Lack of Transportation (Medical): No  . Lack of Transportation (Non-Medical): No  Physical Activity: Insufficiently Active  . Days of Exercise per Week: 7 days  . Minutes of Exercise per Session: 20 min  Stress: No Stress Concern Present  . Feeling of Stress : Not at all  Social Connections: Moderately Isolated  . Frequency of Communication with Friends and Family: Once a week  . Frequency of Social Gatherings with Friends and Family: Once a week  . Attends Religious Services: 1 to 4 times per  year  . Active Member of Clubs or Organizations: No  . Attends Archivist Meetings: Never  . Marital Status: Living with partner  Intimate Partner Violence: Not At Risk  . Fear of Current or Ex-Partner: No  . Emotionally Abused: No  . Physically Abused: No  . Sexually Abused: No    Outpatient Medications Prior to Visit  Medication Sig Dispense Refill  . amLODipine (NORVASC) 5 MG tablet Take by mouth.    Marland Kitchen apixaban (ELIQUIS) 5 MG TABS tablet Take by mouth.    . Coenzyme Q10 100 MG capsule Take by mouth.    . magnesium oxide (MAG-OX) 400 MG tablet Take by mouth.    . simvastatin (ZOCOR) 20 MG tablet Take by mouth.     . sotalol (BETAPACE) 120 MG tablet Take by mouth.    . triamcinolone (KENALOG) 0.1 % APPLY TOPICALLY TO THE AFFECTED AREA TWICE DAILY 30 g 0  . triamterene-hydrochlorothiazide (DYAZIDE) 37.5-25 MG capsule Take 1 capsule by mouth every morning.     No facility-administered medications prior to visit.    Allergies  Allergen Reactions  . No Known Allergies Hives  . Tuberculin Hives    ROS Review of Systems  Constitutional: Negative for chills and fever.  HENT: Negative for congestion and sore throat.   Eyes: Negative for pain and discharge.  Respiratory: Negative for cough and shortness of breath.   Cardiovascular: Negative for chest pain and palpitations.  Gastrointestinal: Negative for constipation, diarrhea, nausea and vomiting.  Endocrine: Negative for polydipsia and polyuria.  Genitourinary: Negative for dysuria and hematuria.  Musculoskeletal: Negative for neck pain and neck stiffness.  Skin: Negative for rash.  Neurological: Negative for dizziness, weakness, numbness and headaches.  Psychiatric/Behavioral: Negative for agitation and behavioral problems.      Objective:    Physical Exam Vitals reviewed.  Constitutional:      General: He is not in acute distress.    Appearance: He is obese. He is not diaphoretic.  HENT:     Head: Normocephalic and atraumatic.     Nose: Nose normal.     Mouth/Throat:     Mouth: Mucous membranes are moist.  Eyes:     General: No scleral icterus.    Extraocular Movements: Extraocular movements intact.     Pupils: Pupils are equal, round, and reactive to light.  Cardiovascular:     Rate and Rhythm: Normal rate and regular rhythm.     Pulses: Normal pulses.     Heart sounds: Normal heart sounds. No murmur heard.   Pulmonary:     Breath sounds: Normal breath sounds. No wheezing or rales.  Abdominal:     Palpations: Abdomen is soft.     Tenderness: There is no abdominal tenderness.  Musculoskeletal:     Cervical back: Neck  supple. No tenderness.     Right lower leg: No edema.     Left lower leg: No edema.  Skin:    General: Skin is warm.     Comments: Scaly patches and erythema over b/l feet  Neurological:     General: No focal deficit present.     Mental Status: He is alert and oriented to person, place, and time.     Sensory: No sensory deficit.     Motor: No weakness.  Psychiatric:        Mood and Affect: Mood normal.        Behavior: Behavior normal.     BP 121/71 (BP Location: Left Arm,  Patient Position: Sitting, Cuff Size: Normal)   Pulse (!) 56   Temp 98.1 F (36.7 C) (Oral)   Resp 16   Ht 6' (1.829 m)   Wt 277 lb 1.9 oz (125.7 kg)   SpO2 98%   BMI 37.58 kg/m  Wt Readings from Last 3 Encounters:  09/21/20 277 lb 1.9 oz (125.7 kg)  06/20/20 275 lb (124.7 kg)  05/18/20 278 lb (126.1 kg)     Health Maintenance Due  Topic Date Due  . Hepatitis C Screening  Never done  . Zoster Vaccines- Shingrix (1 of 2) Never done  . TETANUS/TDAP  10/06/2017    There are no preventive care reminders to display for this patient.  Lab Results  Component Value Date   TSH 4.090 05/12/2020   Lab Results  Component Value Date   WBC 5.1 05/12/2020   HGB 14.5 05/12/2020   HCT 42.9 05/12/2020   MCV 87 05/12/2020   PLT 186 05/12/2020   Lab Results  Component Value Date   NA 141 05/12/2020   K 4.0 05/12/2020   CO2 24 05/12/2020   GLUCOSE 109 (H) 05/12/2020   BUN 20 05/12/2020   CREATININE 1.06 05/12/2020   BILITOT 0.6 05/12/2020   ALKPHOS 68 05/12/2020   AST 16 05/12/2020   ALT 9 05/12/2020   PROT 7.1 05/12/2020   ALBUMIN 4.3 05/12/2020   CALCIUM 9.2 05/12/2020   Lab Results  Component Value Date   CHOL 129 05/12/2020   Lab Results  Component Value Date   HDL 38 (L) 05/12/2020   Lab Results  Component Value Date   LDLCALC 68 05/12/2020   Lab Results  Component Value Date   TRIG 127 05/12/2020   Lab Results  Component Value Date   CHOLHDL 3.4 05/12/2020   Lab Results   Component Value Date   HGBA1C 6.0 (H) 05/12/2020      Assessment & Plan:   Problem List Items Addressed This Visit      Cardiovascular and Mediastinum   Atrial fibrillation ([place])    Rate-controlled On Sotalol and Eliquis Follows up with Cardiologist      Essential hypertension - Primary    BP Readings from Last 1 Encounters:  09/21/20 121/71   Well-controlled with Amlodipine and Dyazide Counseled for compliance with the medications Advised DASH diet and moderate exercise/walking, at least 150 mins/week      Relevant Orders   CBC with Differential/Platelet   CMP14+EGFR     Other   HLD (hyperlipidemia)    On Simvastatin Check lipid profile before next visit      Relevant Orders   Lipid panel    Other Visit Diagnoses    Need for hepatitis C screening test       Relevant Orders   Hepatitis C Antibody   Screening for prostate cancer       Relevant Orders   PSA   Gastroenteritis    Diarrhea resolved now Could be due to food contamination Proper hydration advised       No orders of the defined types were placed in this encounter.   Follow-up: Return in about 6 months (around 03/24/2021) for HTN and blood tests review.    Lindell Spar, MD

## 2020-09-21 NOTE — Assessment & Plan Note (Signed)
Rate-controlled On Sotalol and Eliquis Follows up with Cardiologist 

## 2020-09-21 NOTE — Assessment & Plan Note (Signed)
On Simvastatin Check lipid profile before next visit

## 2020-12-29 ENCOUNTER — Ambulatory Visit (INDEPENDENT_AMBULATORY_CARE_PROVIDER_SITE_OTHER): Payer: Medicare Other

## 2020-12-29 ENCOUNTER — Other Ambulatory Visit: Payer: Self-pay

## 2020-12-29 DIAGNOSIS — Z23 Encounter for immunization: Secondary | ICD-10-CM

## 2021-02-12 ENCOUNTER — Encounter: Payer: Self-pay | Admitting: Internal Medicine

## 2021-02-12 ENCOUNTER — Other Ambulatory Visit: Payer: Self-pay

## 2021-02-12 ENCOUNTER — Ambulatory Visit (INDEPENDENT_AMBULATORY_CARE_PROVIDER_SITE_OTHER): Payer: Medicare Other | Admitting: Internal Medicine

## 2021-02-12 VITALS — BP 123/68 | HR 80 | Temp 98.2°F | Ht 72.0 in | Wt 282.1 lb

## 2021-02-12 DIAGNOSIS — M1712 Unilateral primary osteoarthritis, left knee: Secondary | ICD-10-CM | POA: Diagnosis not present

## 2021-02-12 NOTE — Patient Instructions (Signed)
Please apply Voltaren gel for knee pain and swelling.  Okay to take Tylenol arthritis for knee pain. Avoid taking Aleve or Ibuprofen.  Okay to apply heating pad for knee pain/swelling.

## 2021-02-12 NOTE — Progress Notes (Signed)
Acute Office Visit  Subjective:    Patient ID: Jeffery Sanders, male    DOB: 22-Dec-1945, 75 y.o.   MRN: [id number]  Chief Complaint  Patient presents with   Acute Visit    Left knee pain x 6-7 days. Improving some. No OTC meds spouse has been rubbing Tiger Balm on it which does help some. Felt warm to touch. Now pain is mostly behind knee    HPI Patient is in today for evaluation of left knee pain X 7 days, which started after he came back from his morning walk about a week ago. His pain is sharp, worse in the morning, describes as stiffness and is associated with medial part of knee swelling.  Denies any recent injury.  He has tried [employer] with some relief.  Past Medical History:  Diagnosis Date   Clotting disorder [facility])    Phreesia 04/18/2020   Diverticulosis of colon 09/21/2020   Hypertension    Phreesia 04/18/2020    Past Surgical History:  Procedure Laterality Date   EYE SURGERY N/A    Phreesia 04/18/2020   FRACTURE SURGERY N/A    Phreesia 04/18/2020   HERNIA REPAIR N/A    Phreesia 04/18/2020   SPINE SURGERY N/A    Phreesia 04/18/2020    History reviewed. No pertinent family history.  Social History   Socioeconomic History   Marital status: Single    Spouse name: Not on file   Number of children: Not on file   Years of education: Not on file   Highest education level: Not on file  Occupational History   Not on file  Tobacco Use   Smoking status: Never   Smokeless tobacco: Never  Substance and Sexual Activity   Alcohol use: Never   Drug use: Never   Sexual activity: Yes  Other Topics Concern   Not on file  Social History Narrative   Not on file   Social Determinants of Health   Financial Resource Strain: Low Risk    Difficulty of Paying Living Expenses: Not hard at all  Food Insecurity: No Food Insecurity   Worried About Running Out of Food in the Last Year: Never true   Ran Out of Food in the Last Year: Never true  Transportation Needs: No  Transportation Needs   Lack of Transportation (Medical): No   Lack of Transportation (Non-Medical): No  Physical Activity: Insufficiently Active   Days of Exercise per Week: 7 days   Minutes of Exercise per Session: 20 min  Stress: No Stress Concern Present   Feeling of Stress : Not at all  Social Connections: Moderately Isolated   Frequency of Communication with Friends and Family: Once a week   Frequency of Social Gatherings with Friends and Family: Once a week   Attends Religious Services: 1 to 4 times per year   Active Member of [employer] or Organizations: No   Attends Engineer, structural: Never   Marital Status: Living with partner  Intimate Partner Violence: Not At Risk   Fear of Current or Ex-Partner: No   Emotionally Abused: No   Physically Abused: No   Sexually Abused: No    Outpatient Medications Prior to Visit  Medication Sig Dispense Refill   amLODipine (NORVASC) 5 MG tablet Take by mouth.     apixaban (ELIQUIS) 5 MG TABS tablet Take by mouth.     Coenzyme Q10 100 MG capsule Take by mouth.     magnesium oxide (MAG-OX) 400 MG tablet Take  by mouth.     simvastatin (ZOCOR) 20 MG tablet Take by mouth.     sotalol (BETAPACE) 120 MG tablet Take by mouth.     triamcinolone (KENALOG) 0.1 % APPLY TOPICALLY TO THE AFFECTED AREA TWICE DAILY 30 g 0   triamterene-hydrochlorothiazide (DYAZIDE) 37.5-25 MG capsule Take 1 capsule by mouth every morning.     No facility-administered medications prior to visit.    Allergies  Allergen Reactions   No Known Allergies Hives   Tuberculin Hives    Review of Systems  Constitutional:  Negative for chills and fever.  Respiratory:  Negative for cough and shortness of breath.   Cardiovascular:  Negative for chest pain, palpitations and leg swelling.  Musculoskeletal:  Positive for arthralgias (L knee). Negative for neck pain and neck stiffness.  Skin:  Negative for rash.  Neurological:  Negative for dizziness and weakness.   Psychiatric/Behavioral:  Negative for agitation and behavioral problems.       Objective:    Physical Exam Vitals reviewed.  Constitutional:      General: He is not in acute distress.    Appearance: He is obese. He is not diaphoretic.  HENT:     Head: Normocephalic and atraumatic.     Nose: Nose normal.     Mouth/Throat:     Mouth: Mucous membranes are moist.  Eyes:     General: No scleral icterus.    Extraocular Movements: Extraocular movements intact.     Pupils: Pupils are equal, round, and reactive to light.  Cardiovascular:     Rate and Rhythm: Normal rate and regular rhythm.     Pulses: Normal pulses.     Heart sounds: Normal heart sounds. No murmur heard. Pulmonary:     Breath sounds: Normal breath sounds. No wheezing or rales.  Abdominal:     Palpations: Abdomen is soft.     Tenderness: There is no abdominal tenderness.  Musculoskeletal:     Cervical back: Neck supple. No tenderness.     Right lower leg: No edema.     Left lower leg: No edema.  Skin:    General: Skin is warm.     Comments: Scaly patches and erythema over b/l feet  Neurological:     General: No focal deficit present.     Mental Status: He is alert and oriented to person, place, and time.     Sensory: No sensory deficit.     Motor: No weakness.  Psychiatric:        Mood and Affect: Mood normal.        Behavior: Behavior normal.    BP 123/68 (BP Location: Left Arm, Patient Position: Sitting, Cuff Size: Large)   Pulse 80   Temp 98.2 F (36.8 C) (Oral)   Ht 6' (1.829 m)   Wt 282 lb 1.9 oz (128 kg)   SpO2 95%   BMI 38.26 kg/m  Wt Readings from Last 3 Encounters:  02/12/21 282 lb 1.9 oz (128 kg)  09/21/20 277 lb 1.9 oz (125.7 kg)  06/20/20 275 lb (124.7 kg)    Health Maintenance Due  Topic Date Due   Hepatitis C Screening  Never done   Zoster Vaccines- Shingrix (1 of 2) Never done   TETANUS/TDAP  10/06/2017   COVID-19 Vaccine (4 - Booster for Moderna series) 06/18/2020    There  are no preventive care reminders to display for this patient.   Lab Results  Component Value Date   TSH 4.090 05/12/2020  Lab Results  Component Value Date   WBC 5.1 05/12/2020   HGB 14.5 05/12/2020   HCT 42.9 05/12/2020   MCV 87 05/12/2020   PLT 186 05/12/2020   Lab Results  Component Value Date   NA 141 05/12/2020   K 4.0 05/12/2020   CO2 24 05/12/2020   GLUCOSE 109 (H) 05/12/2020   BUN 20 05/12/2020   CREATININE 1.06 05/12/2020   BILITOT 0.6 05/12/2020   ALKPHOS 68 05/12/2020   AST 16 05/12/2020   ALT 9 05/12/2020   PROT 7.1 05/12/2020   ALBUMIN 4.3 05/12/2020   CALCIUM 9.2 05/12/2020   Lab Results  Component Value Date   CHOL 129 05/12/2020   Lab Results  Component Value Date   HDL 38 (L) 05/12/2020   Lab Results  Component Value Date   LDLCALC 68 05/12/2020   Lab Results  Component Value Date   TRIG 127 05/12/2020   Lab Results  Component Value Date   CHOLHDL 3.4 05/12/2020   Lab Results  Component Value Date   HGBA1C 6.0 (H) 05/12/2020       Assessment & Plan:   Problem List Items Addressed This Visit       Musculoskeletal and Integument   Primary osteoarthritis of left knee - Primary    Check x-ray of left knee Voltaren gel as needed Tylenol arthritis PRN Advised to avoid oral NSAIDs as he is on Eliquis Simple knee exercises       Relevant Orders   DG Knee Complete 4 Views Left     No orders of the defined types were placed in this encounter.    Anabel Halon, MD

## 2021-02-12 NOTE — Assessment & Plan Note (Signed)
Check x-ray of left knee Voltaren gel as needed Tylenol arthritis PRN Advised to avoid oral NSAIDs as he is on Eliquis Simple knee exercises

## 2021-02-13 ENCOUNTER — Ambulatory Visit (HOSPITAL_COMMUNITY)
Admission: RE | Admit: 2021-02-13 | Discharge: 2021-02-13 | Disposition: A | Discharge status: Home / Self Care | Payer: Medicare Other | Source: Ambulatory Visit | Attending: Internal Medicine | Admitting: Internal Medicine

## 2021-02-13 DIAGNOSIS — M1712 Unilateral primary osteoarthritis, left knee: Secondary | ICD-10-CM | POA: Diagnosis not present

## 2021-03-21 LAB — CBC WITH DIFFERENTIAL/PLATELET
Basophils Absolute: 0.1 10*3/uL (ref 0.0–0.2)
Basos: 1 %
EOS (ABSOLUTE): 0.1 10*3/uL (ref 0.0–0.4)
Eos: 2 %
Hematocrit: 44.5 % (ref 37.5–51.0)
Hemoglobin: 14.7 g/dL (ref 13.0–17.7)
Immature Grans (Abs): 0 10*3/uL (ref 0.0–0.1)
Immature Granulocytes: 0 %
Lymphocytes Absolute: 1.6 10*3/uL (ref 0.7–3.1)
Lymphs: 29 %
MCH: 28.3 pg (ref 26.6–33.0)
MCHC: 33 g/dL (ref 31.5–35.7)
MCV: 86 fL (ref 79–97)
Monocytes Absolute: 0.7 10*3/uL (ref 0.1–0.9)
Monocytes: 13 %
Neutrophils Absolute: 3.1 10*3/uL (ref 1.4–7.0)
Neutrophils: 55 %
Platelets: 206 10*3/uL (ref 150–450)
RBC: 5.19 x10E6/uL (ref 4.14–5.80)
RDW: 13.3 % (ref 11.6–15.4)
WBC: 5.7 10*3/uL (ref 3.4–10.8)

## 2021-03-21 LAB — LIPID PANEL
Chol/HDL Ratio: 3.3 ratio (ref 0.0–5.0)
Cholesterol, Total: 123 mg/dL (ref 100–199)
HDL: 37 mg/dL — ABNORMAL LOW (ref >39)
LDL Chol Calc (NIH): 67 mg/dL (ref 0–99)
Triglycerides: 98 mg/dL (ref 0–149)
VLDL Cholesterol Cal: 19 mg/dL (ref 5–40)

## 2021-03-21 LAB — CMP14+EGFR
ALT: 8 IU/L (ref 0–44)
AST: 19 IU/L (ref 0–40)
Albumin/Globulin Ratio: 1.4 (ref 1.2–2.2)
Albumin: 4.1 g/dL (ref 3.7–4.7)
Alkaline Phosphatase: 71 IU/L (ref 44–121)
BUN/Creatinine Ratio: 18 (ref 10–24)
BUN: 17 mg/dL (ref 8–27)
Bilirubin Total: 0.5 mg/dL (ref 0.0–1.2)
CO2: 25 mmol/L (ref 20–29)
Calcium: 9.3 mg/dL (ref 8.6–10.2)
Chloride: 105 mmol/L (ref 96–106)
Creatinine, Ser: 0.96 mg/dL (ref 0.76–1.27)
Globulin, Total: 3 g/dL (ref 1.5–4.5)
Glucose: 112 mg/dL — ABNORMAL HIGH (ref 70–99)
Potassium: 4.2 mmol/L (ref 3.5–5.2)
Sodium: 141 mmol/L (ref 134–144)
Total Protein: 7.1 g/dL (ref 6.0–8.5)
eGFR: 83 mL/min/{1.73_m2} (ref >59)

## 2021-03-21 LAB — HEPATITIS C ANTIBODY: Hep C Virus Ab: <0.1 s/co ratio (ref 0.0–0.9)

## 2021-03-21 LAB — PSA: Prostate Specific Ag, Serum: 1.4 ng/mL (ref 0.0–4.0)

## 2021-03-26 ENCOUNTER — Other Ambulatory Visit: Payer: Self-pay

## 2021-03-26 ENCOUNTER — Encounter: Payer: Self-pay | Admitting: Internal Medicine

## 2021-03-26 ENCOUNTER — Ambulatory Visit (INDEPENDENT_AMBULATORY_CARE_PROVIDER_SITE_OTHER): Payer: Medicare Other | Admitting: Internal Medicine

## 2021-03-26 VITALS — BP 130/76 | HR 64 | Resp 18 | Ht 72.0 in | Wt 284.1 lb

## 2021-03-26 DIAGNOSIS — R7303 Prediabetes: Secondary | ICD-10-CM | POA: Diagnosis not present

## 2021-03-26 DIAGNOSIS — I480 Paroxysmal atrial fibrillation: Secondary | ICD-10-CM

## 2021-03-26 DIAGNOSIS — I10 Essential (primary) hypertension: Secondary | ICD-10-CM

## 2021-03-26 NOTE — Progress Notes (Signed)
Established Patient Office Visit  Subjective:  Patient ID: Jeffery Sanders, male    DOB: 1945-07-19  Age: 75 y.o. MRN: [id number]  CC:  Chief Complaint  Patient presents with   Follow-up    6 month follow up     HPI Jeffery Sanders is a 75 y.o. male with past medical history of HTN, paroxysmal A Fib and HLD who presents for follow up of his chronic medical conditions.   HTN: Patient takes Amlodipine and Triamterene-HCTZ for HTN. BP is well-controlled. Takes medications regularly. Patient denies headache, dizziness, chest pain, dyspnea or palpitations.   Patient has a h/o paroxysmal A Fib, and takes Sotalol and Eliquis. He follows up with Cardiologist at [facility]. No h/o cardiac ablation.  He still has mild stiffness of the left knee, but is stable now.  He has worsening of swelling and knee pain when he tries to walk uphill.  He has been taking Tylenol for knee pain.  Past Medical History:  Diagnosis Date   Clotting disorder [facility])    Phreesia 04/18/2020   Diverticulosis of colon 09/21/2020   Hypertension    Phreesia 04/18/2020    Past Surgical History:  Procedure Laterality Date   EYE SURGERY N/A    Phreesia 04/18/2020   FRACTURE SURGERY N/A    Phreesia 04/18/2020   HERNIA REPAIR N/A    Phreesia 04/18/2020   SPINE SURGERY N/A    Phreesia 04/18/2020    History reviewed. No pertinent family history.  Social History   Socioeconomic History   Marital status: Single    Spouse name: Not on file   Number of children: Not on file   Years of education: Not on file   Highest education level: Not on file  Occupational History   Not on file  Tobacco Use   Smoking status: Never   Smokeless tobacco: Never  Substance and Sexual Activity   Alcohol use: Never   Drug use: Never   Sexual activity: Yes  Other Topics Concern   Not on file  Social History Narrative   Not on file   Social Determinants of Health   Financial Resource Strain: Low Risk    Difficulty of Paying Living  Expenses: Not hard at all  Food Insecurity: No Food Insecurity   Worried About Running Out of Food in the Last Year: Never true   [place] in the Last Year: Never true  Transportation Needs: No Transportation Needs   Lack of Transportation (Medical): No   Lack of Transportation (Non-Medical): No  Physical Activity: Insufficiently Active   Days of Exercise per Week: 7 days   Minutes of Exercise per Session: 20 min  Stress: No Stress Concern Present   Feeling of Stress : Not at all  Social Connections: Moderately Isolated   Frequency of Communication with Friends and Family: Once a week   Frequency of Social Gatherings with Friends and Family: Once a week   Attends Religious Services: 1 to 4 times per year   Active Member of [employer] or Organizations: No   Attends Music therapist: Never   Marital Status: Living with partner  Intimate Partner Violence: Not At Risk   Fear of Current or Ex-Partner: No   Emotionally Abused: No   Physically Abused: No   Sexually Abused: No    Outpatient Medications Prior to Visit  Medication Sig Dispense Refill   amLODipine (NORVASC) 5 MG tablet Take by mouth.     apixaban (ELIQUIS) 5 MG TABS  tablet Take by mouth.     Coenzyme Q10 100 MG capsule Take by mouth.     magnesium oxide (MAG-OX) 400 MG tablet Take by mouth.     simvastatin (ZOCOR) 20 MG tablet Take by mouth.     sotalol (BETAPACE) 120 MG tablet Take by mouth.     triamcinolone (KENALOG) 0.1 % APPLY TOPICALLY TO THE AFFECTED AREA TWICE DAILY 30 g 0   triamterene-hydrochlorothiazide (DYAZIDE) 37.5-25 MG capsule Take 1 capsule by mouth every morning.     No facility-administered medications prior to visit.    Allergies  Allergen Reactions   No Known Allergies Hives   Tuberculin Hives    ROS Review of Systems  Constitutional:  Negative for chills and fever.  Respiratory:  Negative for cough and shortness of breath.   Cardiovascular:  Negative for chest pain,  palpitations and leg swelling.  Musculoskeletal:  Positive for arthralgias (L knee). Negative for neck pain and neck stiffness.  Skin:  Negative for rash.  Neurological:  Negative for dizziness and weakness.  Psychiatric/Behavioral:  Negative for agitation and behavioral problems.      Objective:    Physical Exam Vitals reviewed.  Constitutional:      General: He is not in acute distress.    Appearance: He is obese. He is not diaphoretic.  HENT:     Head: Normocephalic and atraumatic.     Nose: Nose normal.     Mouth/Throat:     Mouth: Mucous membranes are moist.  Eyes:     General: No scleral icterus.    Extraocular Movements: Extraocular movements intact.     Pupils: Pupils are equal, round, and reactive to light.  Cardiovascular:     Rate and Rhythm: Normal rate and regular rhythm.     Pulses: Normal pulses.     Heart sounds: Normal heart sounds. No murmur heard. Pulmonary:     Breath sounds: Normal breath sounds. No wheezing or rales.  Abdominal:     Palpations: Abdomen is soft.     Tenderness: There is no abdominal tenderness.  Musculoskeletal:     Cervical back: Neck supple. No tenderness.     Right lower leg: No edema.     Left lower leg: No edema.  Skin:    General: Skin is warm.     Comments: Scaly patches and erythema over b/l feet  Neurological:     General: No focal deficit present.     Mental Status: He is alert and oriented to person, place, and time.     Sensory: No sensory deficit.     Motor: No weakness.  Psychiatric:        Mood and Affect: Mood normal.        Behavior: Behavior normal.    BP 130/76 (BP Location: Left Arm)   Pulse 64   Resp 18   Ht 6' (1.829 m)   Wt 284 lb 1.3 oz (128.9 kg)   SpO2 94%   BMI 38.53 kg/m  Wt Readings from Last 3 Encounters:  03/26/21 284 lb 1.3 oz (128.9 kg)  02/12/21 282 lb 1.9 oz (128 kg)  09/21/20 277 lb 1.9 oz (125.7 kg)    Lab Results  Component Value Date   TSH 4.090 05/12/2020   Lab Results   Component Value Date   WBC 5.7 03/20/2021   HGB 14.7 03/20/2021   HCT 44.5 03/20/2021   MCV 86 03/20/2021   PLT 206 03/20/2021   Lab Results  Component Value  Date   NA 141 03/20/2021   K 4.2 03/20/2021   CO2 25 03/20/2021   GLUCOSE 112 (H) 03/20/2021   BUN 17 03/20/2021   CREATININE 0.96 03/20/2021   BILITOT 0.5 03/20/2021   ALKPHOS 71 03/20/2021   AST 19 03/20/2021   ALT 8 03/20/2021   PROT 7.1 03/20/2021   ALBUMIN 4.1 03/20/2021   CALCIUM 9.3 03/20/2021   EGFR 83 03/20/2021   Lab Results  Component Value Date   CHOL 123 03/20/2021   Lab Results  Component Value Date   HDL 37 (L) 03/20/2021   Lab Results  Component Value Date   LDLCALC 67 03/20/2021   Lab Results  Component Value Date   TRIG 98 03/20/2021   Lab Results  Component Value Date   CHOLHDL 3.3 03/20/2021   Lab Results  Component Value Date   HGBA1C 6.0 (H) 05/12/2020      Assessment & Plan:   Problem List Items Addressed This Visit       Cardiovascular and Mediastinum   Atrial fibrillation ([place]) - Primary    Rate-controlled On Sotalol and Eliquis Follows up with Cardiologist      Essential hypertension    BP Readings from Last 1 Encounters:  03/26/21 130/76  Well-controlled with Amlodipine and Dyazide Counseled for compliance with the medications Advised DASH diet and moderate exercise/walking, at least 150 mins/week        Other   Prediabetes    HbA1C: 5.8 (12/2019) Diet-controlled      Morbid obesity ([place])    With HTN, prediabetes and HLD Diet modification and moderate exercise advised       No orders of the defined types were placed in this encounter.   Follow-up: Return in about 6 months (around 09/23/2021) for HTN and A Fib.    Lindell Spar, MD

## 2021-03-26 NOTE — Patient Instructions (Addendum)
Please continue to take medications as prescribed.  Please continue to follow low carb diet and perform moderate exercise/walking at least 150 mins/week.  Okay to take Tylenol and apply Voltaren gel for knee pain.

## 2021-03-29 ENCOUNTER — Ambulatory Visit: Payer: Medicare Other | Admitting: Internal Medicine

## 2021-03-29 NOTE — Assessment & Plan Note (Addendum)
With HTN, prediabetes and HLD Diet modification and moderate exercise advised

## 2021-03-29 NOTE — Assessment & Plan Note (Signed)
Rate-controlled On Sotalol and Eliquis Follows up with Cardiologist 

## 2021-03-29 NOTE — Assessment & Plan Note (Signed)
HbA1C: 5.8 (12/2019) Diet-controlled

## 2021-03-29 NOTE — Assessment & Plan Note (Signed)
BP Readings from Last 1 Encounters:  03/26/21 130/76   Well-controlled with Amlodipine and Dyazide Counseled for compliance with the medications Advised DASH diet and moderate exercise/walking, at least 150 mins/week

## 2021-08-20 ENCOUNTER — Ambulatory Visit (INDEPENDENT_AMBULATORY_CARE_PROVIDER_SITE_OTHER): Payer: Medicare Other

## 2021-08-20 DIAGNOSIS — Z0000 Encounter for general adult medical examination without abnormal findings: Secondary | ICD-10-CM

## 2021-08-20 NOTE — Progress Notes (Signed)
?I connected with  Jeffery Sanders on 08/20/21 by a audio enabled telemedicine application and verified that I am speaking with the correct person using two identifiers. ? ?Patient Location: Home ? ?Provider Location: Office/Clinic ? ?I discussed the limitations of evaluation and management by telemedicine. The patient expressed understanding and agreed to proceed.  ?Subjective:  ? Jeffery Sanders is a 76 y.o. male who presents for Medicare Annual/Subsequent preventive examination. ? ?Review of Systems    ? ?Cardiac Risk Factors include: advanced age (>49men, >35 women);dyslipidemia;hypertension;smoking/ tobacco exposure ? ?   ?Objective:  ?  ?There were no vitals filed for this visit. ?There is no height or weight on file to calculate BMI. ? ? ?  08/20/2021  ? 10:12 AM 08/16/2020  ?  1:45 PM  ?Advanced Directives  ?Does Patient Have a Medical Advance Directive? No No  ?Would patient like information on creating a medical advance directive? Yes (ED - Information included in AVS) No - Patient declined  ? ? ?Current Medications (verified) ?Outpatient Encounter Medications as of 08/20/2021  ?Medication Sig  ? amLODipine (NORVASC) 5 MG tablet Take by mouth.  ? apixaban (ELIQUIS) 5 MG TABS tablet Take by mouth.  ? Coenzyme Q10 100 MG capsule Take by mouth.  ? magnesium oxide (MAG-OX) 400 MG tablet Take by mouth.  ? simvastatin (ZOCOR) 20 MG tablet Take by mouth.  ? sotalol (BETAPACE) 120 MG tablet Take by mouth.  ? triamcinolone (KENALOG) 0.1 % APPLY TOPICALLY TO THE AFFECTED AREA TWICE DAILY  ? triamterene-hydrochlorothiazide (DYAZIDE) 37.5-25 MG capsule Take 1 capsule by mouth every morning.  ? ?No facility-administered encounter medications on file as of 08/20/2021.  ? ? ?Allergies (verified) ?No known allergies and Tuberculin, ppd  ? ?History: ?Past Medical History:  ?Diagnosis Date  ? Clotting disorder ([place])   ? Phreesia 04/18/2020  ? Diverticulosis of colon 09/21/2020  ? Hypertension   ? Phreesia 04/18/2020  ? ?Past Surgical  History:  ?Procedure Laterality Date  ? EYE SURGERY N/A   ? Phreesia 04/18/2020  ? FRACTURE SURGERY N/A   ? Phreesia 04/18/2020  ? HERNIA REPAIR N/A   ? Phreesia 04/18/2020  ? SPINE SURGERY N/A   ? Phreesia 04/18/2020  ? ?No family history on file. ?Social History  ? ?Socioeconomic History  ? Marital status: Single  ?  Spouse name: Not on file  ? Number of children: Not on file  ? Years of education: Not on file  ? Highest education level: Not on file  ?Occupational History  ? Not on file  ?Tobacco Use  ? Smoking status: Never  ? Smokeless tobacco: Never  ?Substance and Sexual Activity  ? Alcohol use: Never  ? Drug use: Never  ? Sexual activity: Yes  ?Other Topics Concern  ? Not on file  ?Social History Narrative  ? Not on file  ? ?Social Determinants of Health  ? ?Financial Resource Strain: Low Risk   ? Difficulty of Paying Living Expenses: Not hard at all  ?Food Insecurity: No Food Insecurity  ? Worried About Charity fundraiser in the Last Year: Never true  ? Ran Out of Food in the Last Year: Never true  ?Transportation Needs: No Transportation Needs  ? Lack of Transportation (Medical): No  ? Lack of Transportation (Non-Medical): No  ?Physical Activity: Sufficiently Active  ? Days of Exercise per Week: 7 days  ? Minutes of Exercise per Session: 40 min  ?Stress: No Stress Concern Present  ? Feeling of Stress : Not  at all  ?Social Connections: Moderately Integrated  ? Frequency of Communication with Friends and Family: Three times a week  ? Frequency of Social Gatherings with Friends and Family: Three times a week  ? Attends Religious Services: 1 to 4 times per year  ? Active Member of Clubs or Organizations: No  ? Attends Banker Meetings: Never  ? Marital Status: Living with partner  ? ? ?Tobacco Counseling ?Counseling given: Not Answered ? ? ?Clinical Intake: ? ?Pre-visit preparation completed: No ? ?Pain : No/denies pain ? ?  ? ?  ? ?How often do you need to have someone help you when you read  instructions, pamphlets, or other written materials from your doctor or pharmacy?: 1 - Never ? ?Diabetic?no ? ?  ? ?  ? ? ?Activities of Daily Living ? ?  08/20/2021  ? 10:03 AM  ?In your present state of health, do you have any difficulty performing the following activities:  ?Hearing? 0  ?Vision? 0  ?Difficulty concentrating or making decisions? 0  ?Walking or climbing stairs? 0  ?Dressing or bathing? 0  ?Doing errands, shopping? 0  ?Preparing Food and eating ? N  ?Using the Toilet? N  ?In the past six months, have you accidently leaked urine? N  ?Do you have problems with loss of bowel control? N  ?Managing your Medications? N  ?Managing your Finances? N  ?Housekeeping or managing your Housekeeping? N  ? ? ?Patient Care Team: ?Anabel Halon, MD as PCP - General (Internal Medicine) ? ?Indicate any recent Medical Services you may have received from other than [facility] providers in the past year (date may be approximate). ? ?   ?Assessment:  ? This is a routine wellness examination for Jeffery Sanders. ? ?Hearing/Vision screen ?No results found. ? ?Dietary issues and exercise activities discussed: ?Exercise limited by: None identified ? ? Goals Addressed   ? ?  ?  ?  ?  ? This Visit's Progress  ?  Patient Stated     ?  Continue walking 1-2 miles daily as able. Wonderful job! ?  ?  Prevent falls     ? ?  ? ?Depression Screen ? ?  08/20/2021  ? 10:07 AM 03/26/2021  ? 10:55 AM 02/12/2021  ?  3:42 PM 09/21/2020  ?  2:06 PM 08/16/2020  ?  1:51 PM 06/20/2020  ?  9:14 AM 05/18/2020  ?  1:57 PM  ?PHQ 2/9 Scores  ?PHQ - 2 Score 0 0 0 0 0 0 0  ?  ?Fall Risk ? ?  08/20/2021  ? 10:01 AM 03/26/2021  ? 10:55 AM 02/12/2021  ?  3:42 PM 09/21/2020  ?  2:05 PM 08/16/2020  ?  1:49 PM  ?Fall Risk   ?Falls in the past year? 0 0 0 0 1  ?Number falls in past yr: 0 0 0 0 0  ?Injury with Fall? 0 0 0 0 1  ?Risk for fall due to :  No Fall Risks No Fall Risks No Fall Risks History of fall(s)  ?Follow up  Falls evaluation completed Falls evaluation completed Falls  evaluation completed Falls evaluation completed;Falls prevention discussed  ? ? ?FALL RISK PREVENTION PERTAINING TO THE HOME: ? ?Any stairs in or around the home? No  ?If so, are there any without handrails? No  ?Home free of loose throw rugs in walkways, pet beds, electrical cords, etc? Yes  ?Adequate lighting in your home to reduce risk of falls? Yes  ? ?ASSISTIVE  DEVICES UTILIZED TO PREVENT FALLS: ? ?Life alert? No  ?Use of a cane, walker or w/c? No  ?Grab bars in the bathroom? No  ?Shower chair or bench in shower? No  ?Elevated toilet seat or a handicapped toilet? No  ? ?Cognitive Function: ?  ?  ? ?  08/20/2021  ? 10:03 AM  ?6CIT Screen  ?What Year? 0 points  ?What month? 0 points  ?What time? 0 points  ?Count back from 20 0 points  ?Months in reverse 0 points  ?Repeat phrase 4 points  ?Total Score 4 points  ? ? ?Immunizations ?Immunization History  ?Administered Date(s) Administered  ? Fluad Quad(high Dose 65+) 12/29/2020  ? Influenza, Seasonal, Injecte, Preservative Fre 03/22/2015  ? Influenza,inj,Quad PF,6+ Mos 02/26/2016, 01/27/2017  ? Influenza-Unspecified 03/13/2001, 03/05/2004, 02/25/2005, 03/05/2007, 02/11/2008, 02/10/2009, 01/27/2010, 01/28/2011, 01/28/2012, 02/13/2013, 03/29/2013, 01/27/2020  ? Moderna Sars-Covid-2 Vaccination 05/14/2019, 06/12/2019, 02/16/2020  ? Pneumococcal Conjugate-13 07/26/2016, 08/30/2016  ? Pneumococcal Polysaccharide-23 09/01/2017  ? Pneumococcal-Unspecified 08/12/2012  ? Td 10/07/2007  ? ? ?TDAP status: Due, Education has been provided regarding the importance of this vaccine. Advised may receive this vaccine at local pharmacy or Health Dept. Aware to provide a copy of the vaccination record if obtained from local pharmacy or Health Dept. Verbalized acceptance and understanding. ? ?Flu Vaccine status: Up to date ? ?Pneumococcal vaccine status: Up to date ? ?Covid-19 vaccine status: Completed vaccines -states he would like to get the most up to date booster  ? ?Qualifies for  Shingles Vaccine? Yes   ?Zostavax completed No   ?Shingrix Completed?: No.    Education has been provided regarding the importance of this vaccine. Patient has been advised to call insurance company to [place]

## 2021-08-20 NOTE — Patient Instructions (Signed)
?  Mr. Jeffery Sanders , ?Thank you for taking time to come for your Medicare Wellness Visit. I appreciate your ongoing commitment to your health goals. Please review the following plan we discussed and let me know if I can assist you in the future.  ? ?These are the goals we discussed: ? Goals   ? ?  Patient Stated   ?  Continue walking 1-2 miles daily as able. Wonderful job! ?  ?  Prevent falls   ?  Weight (lb) < 225 lb (102.1 kg)   ? ?  ?  ?This is a list of the screening recommended for you and due dates:  ?Health Maintenance  ?Topic Date Due  ? Zoster (Shingles) Vaccine (1 of 2) Never done  ? Tetanus Vaccine  10/06/2017  ? COVID-19 Vaccine (4 - Booster for Moderna series) 04/12/2020  ? Flu Shot  11/27/2021  ? Colon Cancer Screening  09/27/2029  ? Pneumonia Vaccine  Completed  ? Hepatitis C Screening: USPSTF Recommendation to screen - Ages 40-79 yo.  Completed  ? HPV Vaccine  Aged Out  ?  ?

## 2021-08-21 ENCOUNTER — Encounter: Payer: Self-pay | Admitting: Family Medicine

## 2021-08-21 ENCOUNTER — Ambulatory Visit (INDEPENDENT_AMBULATORY_CARE_PROVIDER_SITE_OTHER): Payer: Medicare Other | Admitting: Family Medicine

## 2021-08-21 VITALS — BP 140/82 | HR 60 | Ht 72.0 in | Wt 288.8 lb

## 2021-08-21 DIAGNOSIS — L570 Actinic keratosis: Secondary | ICD-10-CM | POA: Insufficient documentation

## 2021-08-21 MED ORDER — ADAPALENE 0.1 % EX CREA: TOPICAL_CREAM | Freq: Every day | CUTANEOUS | 0 refills | Status: DC

## 2021-08-21 NOTE — Assessment & Plan Note (Signed)
Referral to dermatology.  

## 2021-08-21 NOTE — Progress Notes (Signed)
? ?Established Patient Office Visit ? ?Subjective:  ?Patient ID: Jeffery Sanders, male    DOB: April 28, 1946  Age: 76 y.o. MRN: [id number] ? ?CC:  ?Chief Complaint  ?Patient presents with  ? Rash  ?  Pt has sores/rash that is spreading on both hands, also spreading on his chin and facial area. Pt first noticed it 5-6 months ago on hands and 1 month ago on facial area.   ? ? ?HPI ?Jeffery Sanders is a 77 y.o. male with past medical history of HTN, paroxysmal A Fib, and HLD presents with complaints of several rashes on the hands and under his chin for 6 months. He states that he noticed the rash under his chin 1 month ago. He had used tea tree oil, hand creams, and heel balms to no avail. He reports burning with the rash under his chin and itching with the rash in his hands. He denies fever, pain, and numbness. ? ?Past Medical History:  ?Diagnosis Date  ? Clotting disorder ([place])   ? [employer]rees[employer] 04/18/2020  ? Diverticulosis of colon 09/21/2020  ? Hypertension   ? [employer]rees[employer] 04/18/2020  ? ? ?Past Surgical History:  ?Procedure Laterality Date  ? EYE SURGERY N/A   ? [employer] 04/18/2020  ? FRACTURE SURGERY N/A   ? [employer] 04/18/2020  ? HERNIA REPAIR N/A   ? [employer] 04/18/2020  ? SPINE SURGERY N/A   ? [employer]rees[employer] 04/18/2020  ? ? ?History reviewed. No pertinent family history. ? ?Social History  ? ?Socioeconomic History  ? Marital status: Single  ?  Spouse name: Not on file  ? Number of children: Not on file  ? Years of education: Not on file  ? Highest education level: Not on file  ?Occupational History  ? Not on file  ?Tobacco Use  ? Smoking status: Never  ? Smokeless tobacco: Never  ?Substance and Sexual Activity  ? Alcohol use: Never  ? Drug use: Never  ? Sexual activity: Yes  ?Other Topics Concern  ? Not on file  ?Social History Narrative  ? Not on file  ? ?Social Determinants of Health  ? ?Financial Resource Strain: Low Risk   ? Difficulty of Paying Living Expenses: Not hard at all  ?Food Insecurity: No Food Insecurity  ? Worried About  Charity fundraiser in the Last Year: Never true  ? Ran Out of Food in the Last Year: Never true  ?Transportation Needs: No Transportation Needs  ? Lack of Transportation (Medical): No  ? Lack of Transportation (Non-Medical): No  ?Physical Activity: Sufficiently Active  ? Days of Exercise per Week: 7 days  ? Minutes of Exercise per Session: 40 min  ?Stress: No Stress Concern Present  ? Feeling of Stress : Not at all  ?Social Connections: Moderately Integrated  ? Frequency of Communication with Friends and Family: Three times a week  ? Frequency of Social Gatherings with Friends and Family: Three times a week  ? Attends Religious Services: 1 to 4 times per year  ? Active Member of Clubs or Organizations: No  ? Attends Archivist Meetings: Never  ? Marital Status: Living with partner  ?Intimate Partner Violence: Not At Risk  ? Fear of Current or Ex-Partner: No  ? Emotionally Abused: No  ? Physically Abused: No  ? Sexually Abused: No  ? ? ?Outpatient Medications Prior to Visit  ?Medication Sig Dispense Refill  ? amLODipine (NORVASC) 5 MG tablet Take by mouth.    ? apixaban (ELIQUIS) 5 MG TABS tablet  Take by mouth.    ? Coenzyme Q10 100 MG capsule Take by mouth.    ? magnesium oxide (MAG-OX) 400 MG tablet Take by mouth.    ? simvastatin (ZOCOR) 20 MG tablet Take by mouth.    ? sotalol (BETAPACE) 120 MG tablet Take by mouth.    ? triamcinolone (KENALOG) 0.1 % APPLY TOPICALLY TO THE AFFECTED AREA TWICE DAILY 30 g 0  ? triamterene-hydrochlorothiazide (DYAZIDE) 37.5-25 MG capsule Take 1 capsule by mouth every morning.    ? ?No facility-administered medications prior to visit.  ? ? ?Allergies  ?Allergen Reactions  ? No Known Allergies Hives  ? Tuberculin, Ppd Hives  ? ? ?ROS ?Review of Systems  ?Constitutional:  Negative for chills, fatigue and fever.  ?HENT:  Negative for rhinorrhea, sinus pain and sore throat.   ?Eyes:  Negative for pain, redness and itching.  ?Respiratory:  Negative for chest tightness and  shortness of breath.   ?Cardiovascular:  Negative for chest pain and palpitations.  ?     Has irregular heartbeat  ?Gastrointestinal:  Negative for constipation, diarrhea, nausea and vomiting.  ?Endocrine: Positive for polyuria (on diruetic). Negative for polydipsia and polyphagia.  ?Genitourinary:  Negative for frequency and urgency.  ?Musculoskeletal:  Negative for gait problem and neck pain.  ?Skin:  Positive for rash.  ?Neurological:  Negative for dizziness, tremors, weakness and headaches.  ?Psychiatric/Behavioral:  Negative for confusion and sleep disturbance.   ? ?  ?Objective:  ?  ?Physical Exam ?Constitutional:   ?   Appearance: Normal appearance.  ?HENT:  ?   Head: Normocephalic.  ?   Right Ear: External ear normal.  ?   Left Ear: External ear normal.  ?   Nose: No congestion or rhinorrhea.  ?   Mouth/Throat:  ?   Mouth: Mucous membranes are moist.  ?Cardiovascular:  ?   Rate and Rhythm: Normal rate and regular rhythm.  ?   Pulses: Normal pulses.  ?   Heart sounds: Normal heart sounds.  ?Pulmonary:  ?   Effort: Pulmonary effort is normal.  ?   Breath sounds: Normal breath sounds.  ?Abdominal:  ?   Palpations: Abdomen is soft.  ?Musculoskeletal:  ?   Cervical back: No rigidity.  ?Skin: ?   General: Skin is warm.  ?   Findings: Lesion present.  ?   Comments: Multiple dry round rough texture scaly patch on the hands and under the chin  ?Neurological:  ?   Mental Status: He is alert.  ? ? ?BP 140/82 (BP Location: Right Arm, Patient Position: Sitting)   Pulse 60   Ht 6' (1.829 m)   Wt 288 lb 12.8 oz (131 kg)   SpO2 97%   BMI 39.17 kg/m?  ?Wt Readings from Last 3 Encounters:  ?08/21/21 288 lb 12.8 oz (131 kg)  ?03/26/21 284 lb 1.3 oz (128.9 kg)  ?02/12/21 282 lb 1.9 oz (128 kg)  ? ? ?Lab Results  ?Component Value Date  ? TSH 4.090 05/12/2020  ? ?Lab Results  ?Component Value Date  ? WBC 5.7 03/20/2021  ? HGB 14.7 03/20/2021  ? HCT 44.5 03/20/2021  ? MCV 86 03/20/2021  ? PLT 206 03/20/2021  ? ?Lab Results   ?Component Value Date  ? NA 141 03/20/2021  ? K 4.2 03/20/2021  ? CO2 25 03/20/2021  ? GLUCOSE 112 (H) 03/20/2021  ? BUN 17 03/20/2021  ? CREATININE 0.96 03/20/2021  ? BILITOT 0.5 03/20/2021  ? ALKPHOS 71 03/20/2021  ?  AST 19 03/20/2021  ? ALT 8 03/20/2021  ? PROT 7.1 03/20/2021  ? ALBUMIN 4.1 03/20/2021  ? CALCIUM 9.3 03/20/2021  ? EGFR 83 03/20/2021  ? ?Lab Results  ?Component Value Date  ? CHOL 123 03/20/2021  ? ?Lab Results  ?Component Value Date  ? HDL 37 (L) 03/20/2021  ? ?Lab Results  ?Component Value Date  ? [place] 67 03/20/2021  ? ?Lab Results  ?Component Value Date  ? TRIG 98 03/20/2021  ? ?Lab Results  ?Component Value Date  ? CHOLHDL 3.3 03/20/2021  ? ?Lab Results  ?Component Value Date  ? HGBA1C 6.0 (H) 05/12/2020  ? ? ?  ?Assessment & Plan:  ? ?Problem List Items Addressed This Visit   ? ?  ? Musculoskeletal and Integument  ? Actinic keratosis - Primary  ?  Referral to dermatology ? ? ?  ?  ? Relevant Medications  ? adapalene (DIFFERIN) 0.1 % cream  ? Other Relevant Orders  ? Ambulatory referral to Dermatology  ? ? ?Meds ordered this encounter  ?Medications  ? adapalene (DIFFERIN) 0.1 % cream  ?  Sig: Apply topically at bedtime.  ?  Dispense:  45 g  ?  Refill:  0  ? ? ?Follow-up: No follow-ups on file.  ? ? ?Alvira Monday, FNP ?

## 2021-08-21 NOTE — Patient Instructions (Addendum)
I appreciate the opportunity to provide care to you today! ?  ? -A referral was made to Dermatology (Dr. Margo Aye) ?-They will contact you to set an appointment ?-In the interim, you can apply topical Adapalene cream  ?-Please pick up Adapalene cream at the pharmacy ? ? ?  ?It was a pleasure to see you and I look forward to continuing to work together on your health and well-being. ?Please do not hesitate to call the office if you need care or have questions about your care. ?  ?Have a wonderful day and week. ?With Gratitude, ?Gilmore Laroche MSN, FNP-BC ? ?

## 2021-08-22 ENCOUNTER — Ambulatory Visit: Payer: Medicare Other

## 2021-09-04 ENCOUNTER — Ambulatory Visit (INDEPENDENT_AMBULATORY_CARE_PROVIDER_SITE_OTHER): Payer: Medicare Other | Admitting: Family Medicine

## 2021-09-04 ENCOUNTER — Encounter: Payer: Self-pay | Admitting: Family Medicine

## 2021-09-04 VITALS — BP 138/82 | HR 79 | Ht 72.0 in | Wt 283.4 lb

## 2021-09-04 DIAGNOSIS — J0110 Acute frontal sinusitis, unspecified: Secondary | ICD-10-CM | POA: Diagnosis not present

## 2021-09-04 DIAGNOSIS — R059 Cough, unspecified: Secondary | ICD-10-CM

## 2021-09-04 MED ORDER — BENZONATATE 100 MG PO CAPS
100.0000 mg | ORAL_CAPSULE | Freq: Two times a day (BID) | ORAL | 0 refills | Status: DC | PRN
Start: 2021-09-04 — End: 2021-09-25

## 2021-09-04 MED ORDER — AMOXICILLIN-POT CLAVULANATE 875-125 MG PO TABS
1.0000 | ORAL_TABLET | Freq: Two times a day (BID) | ORAL | 0 refills | Status: AC
Start: 2021-09-04 — End: 2021-09-11

## 2021-09-04 NOTE — Assessment & Plan Note (Signed)
Tessalon perles for cough ?

## 2021-09-04 NOTE — Assessment & Plan Note (Signed)
Antibiotic order with instructions to complete the full course of treatment ?

## 2021-09-04 NOTE — Patient Instructions (Signed)
I appreciate the opportunity to provide care to you today! ? ? ? Please pick up your medication at your pharmacy and complete the full course of your antibiotics. ? ? ?  ?It was a pleasure to see you and I look forward to continuing to work together on your health and well-being. ?Please do not hesitate to call the office if you need care or have questions about your care. ?  ?Have a wonderful day and week. ?With Gratitude, ?Alvira Monday MSN, FNP-BC ? ?

## 2021-09-04 NOTE — Progress Notes (Signed)
? ?Acute Office Visit ? ?Subjective:  ? ?  ?Patient ID: Jeffery Sanders, male    DOB: 24-May-1945, 76 y.o.   MRN: [id number] ? ?Chief Complaint  ?Patient presents with  ? Cough  ?  Pt complains of severe cough that started on 08/30/2021. Has been taking OTC medication that has not helped.   ? ? ?Cough ?This is a new problem. The current episode started in the past 7 days. The problem has been gradually worsening. The problem occurs every few minutes. The cough is Productive of sputum. Associated symptoms include chills, headaches (sinus headaches) and shortness of breath. Pertinent negatives include no chest pain, ear pain, rhinorrhea or sore throat. Nothing aggravates the symptoms. He has tried OTC cough suppressant for the symptoms. The treatment provided mild relief. There is no history of COPD or environmental allergies.  ? ?Patient is in today with complaints of a cough that started on 08/30/2021 and has not relented. He reports taking OTC Coricidin and dextromethorphan dm for cough with mild relief. He reports sinus headaches and chills with no fever. His cough is productive, with a clear phlegm that has turned yellow in the last two days. He reports increased fatigue due to sleep disturbance from his cough.  ? ? ? ?Review of Systems  ?Constitutional:  Positive for chills.  ?HENT:  Negative for ear pain, rhinorrhea, sore throat and tinnitus.   ?Eyes:  Negative for blurred vision, double vision and photophobia.  ?Respiratory:  Positive for cough and shortness of breath.   ?Cardiovascular:  Negative for chest pain and palpitations.  ?Gastrointestinal:  Negative for abdominal pain, constipation and diarrhea.  ?Genitourinary:  Negative for frequency, hematuria and urgency.  ?Musculoskeletal:  Negative for back pain and falls.  ?Skin:  Negative for itching.  ?Neurological:  Positive for headaches (sinus headaches).  ?Endo/Heme/Allergies:  Negative for environmental allergies.  ?Psychiatric/Behavioral:  The patient has  insomnia.   ? ? ?   ?Objective:  ?  ?BP 138/82 (BP Location: Right Arm, Patient Position: Sitting)   Pulse 79   Ht 6' (1.829 m)   Wt 283 lb 6.4 oz (128.5 kg)   SpO2 93%   BMI 38.44 kg/m?  ? ? ?Physical Exam ?HENT:  ?   Head: Normocephalic.  ?   Right Ear: External ear normal.  ?   Left Ear: External ear normal.  ?   Nose: No congestion or rhinorrhea.  ?   Right Sinus: Frontal sinus tenderness present.  ?   Left Sinus: Frontal sinus tenderness present.  ?   Mouth/Throat:  ?   Mouth: Mucous membranes are moist.  ?Eyes:  ?   General:     ?   Right eye: No discharge.     ?   Left eye: No discharge.  ?Cardiovascular:  ?   Rate and Rhythm: Normal rate and regular rhythm.  ?   Heart sounds: Normal heart sounds.  ?Pulmonary:  ?   Effort: Pulmonary effort is normal.  ?   Breath sounds: Normal breath sounds.  ?Neurological:  ?   Mental Status: He is alert.  ? ? ?No results found for any visits on 09/04/21. ? ? ?   ?Assessment & Plan:  ? ?Problem List Items Addressed This Visit   ? ?  ? Respiratory  ? Sinusitis, acute frontal  ?  Antibiotic order with instructions to complete the full course of treatment ? ?  ?  ? Relevant Medications  ? Chlorpheniramine-DM (CORICIDIN HBP COUGH/COLD PO)  ?  benzonatate (TESSALON) 100 MG capsule  ? amoxicillin-clavulanate (AUGMENTIN) 875-125 MG tablet  ?  ? Other  ? Cough in adult  ?  Tessalon perles for cough ? ?  ?  ? ?Other Visit Diagnoses   ? ? Subacute frontal sinusitis    -  Primary  ? Relevant Medications  ? Chlorpheniramine-DM (CORICIDIN HBP COUGH/COLD PO)  ? benzonatate (TESSALON) 100 MG capsule  ? amoxicillin-clavulanate (AUGMENTIN) 875-125 MG tablet  ? Acute cough      ? Relevant Medications  ? benzonatate (TESSALON) 100 MG capsule  ? ?  ? ? ?Meds ordered this encounter  ?Medications  ? benzonatate (TESSALON) 100 MG capsule  ?  Sig: Take 1 capsule (100 mg total) by mouth 2 (two) times daily as needed for cough.  ?  Dispense:  20 capsule  ?  Refill:  0  ? amoxicillin-clavulanate  (AUGMENTIN) 875-125 MG tablet  ?  Sig: Take 1 tablet by mouth 2 (two) times daily for 7 days.  ?  Dispense:  14 tablet  ?  Refill:  0  ? ? ?No follow-ups on file. ? ?Gilmore Laroche, FNP ? ? ?

## 2021-09-05 ENCOUNTER — Telehealth: Payer: Self-pay

## 2021-09-05 NOTE — Telephone Encounter (Signed)
Yes, that's fine 

## 2021-09-05 NOTE — Telephone Encounter (Signed)
Pt informed

## 2021-09-10 ENCOUNTER — Telehealth: Payer: Self-pay | Admitting: Family Medicine

## 2021-09-10 NOTE — Telephone Encounter (Signed)
Kim called in regards to pt. States he is still having cough & it is not is bad, when he wakes up has sinus headache, night sweats. Abx ends today. Pt would like to know what else to do? ?

## 2021-09-11 ENCOUNTER — Telehealth: Payer: Medicare Other | Admitting: Internal Medicine

## 2021-09-11 NOTE — Telephone Encounter (Signed)
Pt informed states understanding 

## 2021-09-25 ENCOUNTER — Ambulatory Visit (INDEPENDENT_AMBULATORY_CARE_PROVIDER_SITE_OTHER): Payer: Medicare Other | Admitting: Internal Medicine

## 2021-09-25 ENCOUNTER — Encounter: Payer: Self-pay | Admitting: Internal Medicine

## 2021-09-25 VITALS — BP 128/88 | HR 61 | Resp 18 | Ht 72.0 in | Wt 280.8 lb

## 2021-09-25 DIAGNOSIS — E559 Vitamin D deficiency, unspecified: Secondary | ICD-10-CM

## 2021-09-25 DIAGNOSIS — M19042 Primary osteoarthritis, left hand: Secondary | ICD-10-CM

## 2021-09-25 DIAGNOSIS — R7303 Prediabetes: Secondary | ICD-10-CM

## 2021-09-25 DIAGNOSIS — I480 Paroxysmal atrial fibrillation: Secondary | ICD-10-CM

## 2021-09-25 DIAGNOSIS — M19041 Primary osteoarthritis, right hand: Secondary | ICD-10-CM

## 2021-09-25 DIAGNOSIS — E782 Mixed hyperlipidemia: Secondary | ICD-10-CM | POA: Diagnosis not present

## 2021-09-25 DIAGNOSIS — Z7901 Long term (current) use of anticoagulants: Secondary | ICD-10-CM | POA: Insufficient documentation

## 2021-09-25 DIAGNOSIS — I10 Essential (primary) hypertension: Secondary | ICD-10-CM | POA: Diagnosis not present

## 2021-09-25 DIAGNOSIS — N401 Enlarged prostate with lower urinary tract symptoms: Secondary | ICD-10-CM

## 2021-09-25 NOTE — Assessment & Plan Note (Signed)
Advised to take Vitamin 1000 IU QD

## 2021-09-25 NOTE — Progress Notes (Addendum)
Established Patient Office Visit  Subjective:  Patient ID: Jeffery Sanders, male    DOB: September 28, 1945  Age: 76 y.o. MRN: [id number]  CC:  Chief Complaint  Patient presents with   Follow-up    6 month follow up HTN and A fib saw dr hall for hands dr hall recommended he have fingers xrayed due to arthritis     HPI Jeffery Sanders is a 76 y.o. male with past medical history of HTN, paroxysmal A Fib and HLD who presents for f/u of his chronic medical conditions.  HTN: He takes Amlodipine and Triamterene-HCTZ for HTN. BP is well-controlled. Takes medications regularly. Patient denies headache, dizziness, chest pain, dyspnea or palpitations.  He has a h/o paroxysmal A Fib, and takes Sotalol and Eliquis. He follows up with Cardiologist at [facility]. No h/o cardiac ablation.  He needs recently saw Dr. [place] Crane for rash on his hands and feet.  He was given CeraVe for psoriasis and Diprolene cream for itching and rash, which have helped.  He has chronic nodes on small joints of hands and has stiffness of the joints, but denies any severe pain.  He has deviated distal ends of fingers, which is chronic.   Past Medical History:  Diagnosis Date   Clotting disorder [place] [facility])    [employer]rees[employer] 04/18/2020   Diverticulosis of colon 09/21/2020   Hypertension    [employer]reesia 04/18/2020    Past Surgical History:  Procedure Laterality Date   EYE SURGERY N/A    [employer]reesia 04/18/2020   FRACTURE SURGERY N/A    [employer]reesia 04/18/2020   HERNIA REPAIR N/A    [employer]reesia 04/18/2020   SPINE SURGERY N/A    Phreesia 04/18/2020    History reviewed. No pertinent family history.  Social History   Socioeconomic History   Marital status: Single    Spouse name: Not on file   Number of children: Not on file   Years of education: Not on file   Highest education level: Not on file  Occupational History   Not on file  Tobacco Use   Smoking status: Never   Smokeless tobacco: Never  Substance and Sexual Activity   Alcohol use: Never   Drug  use: Never   Sexual activity: Yes  Other Topics Concern   Not on file  Social History Narrative   Not on file   Social Determinants of Health   Financial Resource Strain: Low Risk    Difficulty of Paying Living Expenses: Not hard at all  Food Insecurity: No Food Insecurity   Worried About Running Out of Food in the Last Year: Never true   [place] in the Last Year: Never true  Transportation Needs: No Transportation Needs   Lack of Transportation (Medical): No   Lack of Transportation (Non-Medical): No  Physical Activity: Sufficiently Active   Days of Exercise per Week: 7 days   Minutes of Exercise per Session: 40 min  Stress: No Stress Concern Present   Feeling of Stress : Not at all  Social Connections: Moderately Integrated   Frequency of Communication with Friends and Family: Three times a week   Frequency of Social Gatherings with Friends and Family: Three times a week   Attends Religious Services: 1 to 4 times per year   Active Member of Clubs or Organizations: No   Attends Archivist Meetings: Never   Marital Status: Living with partner  Intimate Partner Violence: Not At Risk   Fear of Current or Ex-Partner: No   Emotionally  Abused: No   Physically Abused: No   Sexually Abused: No    Outpatient Medications Prior to Visit  Medication Sig Dispense Refill   adapalene (DIFFERIN) 0.1 % cream Apply topically at bedtime. 45 g 0   amLODipine (NORVASC) 5 MG tablet Take by mouth.     apixaban (ELIQUIS) 5 MG TABS tablet Take by mouth.     augmented betamethasone dipropionate (DIPROLENE-AF) 0.05 % cream Apply topically.     clobetasol (TEMOVATE) 0.05 % external solution Apply topically.     Coenzyme Q10 100 MG capsule Take by mouth.     fluticasone (CUTIVATE) 0.05 % cream Apply topically.     magnesium oxide (MAG-OX) 400 MG tablet Take by mouth.     simvastatin (ZOCOR) 20 MG tablet Take by mouth.     sotalol (BETAPACE) 120 MG tablet Take by mouth.      triamcinolone (KENALOG) 0.1 % APPLY TOPICALLY TO THE AFFECTED AREA TWICE DAILY 30 g 0   triamterene-hydrochlorothiazide (DYAZIDE) 37.5-25 MG capsule Take 1 capsule by mouth every morning.     benzonatate (TESSALON) 100 MG capsule Take 1 capsule (100 mg total) by mouth 2 (two) times daily as needed for cough. (Patient not taking: Reported on 09/25/2021) 20 capsule 0   Chlorpheniramine-DM (CORICIDIN HBP COUGH/COLD PO) Take 325 mg by mouth. (Patient not taking: Reported on 09/25/2021)     No facility-administered medications prior to visit.    Allergies  Allergen Reactions   No Known Allergies Hives   Tuberculin, Ppd Hives    ROS Review of Systems  Constitutional:  Negative for chills and fever.  HENT:  Negative for congestion, postnasal drip and sore throat.   Eyes:  Negative for pain and discharge.  Respiratory:  Negative for cough and shortness of breath.   Cardiovascular:  Negative for chest pain, palpitations and leg swelling.  Gastrointestinal:  Negative for abdominal pain, constipation, diarrhea and vomiting.  Genitourinary:  Negative for dysuria and hematuria.  Musculoskeletal:  Positive for arthralgias (L knee). Negative for neck pain and neck stiffness.  Skin:  Negative for rash.  Neurological:  Negative for dizziness and weakness.  Psychiatric/Behavioral:  Negative for agitation and behavioral problems.      Objective:    Physical Exam Vitals reviewed.  Constitutional:      General: He is not in acute distress.    Appearance: He is obese. He is not diaphoretic.  HENT:     Head: Normocephalic and atraumatic.     Nose: Nose normal.     Mouth/Throat:     Mouth: Mucous membranes are moist.  Eyes:     General: No scleral icterus.    Extraocular Movements: Extraocular movements intact.     Pupils: Pupils are equal, round, and reactive to light.  Cardiovascular:     Rate and Rhythm: Normal rate and regular rhythm.     Pulses: Normal pulses.     Heart sounds: Normal  heart sounds. No murmur heard. Pulmonary:     Breath sounds: Normal breath sounds. No wheezing or rales.  Abdominal:     Palpations: Abdomen is soft.     Tenderness: There is no abdominal tenderness.  Musculoskeletal:     Cervical back: Neck supple. No tenderness.     Right lower leg: No edema.     Left lower leg: No edema.     Comments: Heberden and Bouchard nodes noted bilaterally with ulnar deviation  Skin:    General: Skin is warm.  Comments: Scaly patches and erythema over b/l feet  Neurological:     General: No focal deficit present.     Mental Status: He is alert and oriented to person, place, and time.     Sensory: No sensory deficit.     Motor: No weakness.  Psychiatric:        Mood and Affect: Mood normal.        Behavior: Behavior normal.    BP 128/88 (BP Location: Right Arm, Patient Position: Sitting, Cuff Size: Normal)   Pulse 61   Resp 18   Ht 6' (1.829 m)   Wt 280 lb 12.8 oz (127.4 kg)   SpO2 96%   BMI 38.08 kg/m  Wt Readings from Last 3 Encounters:  09/25/21 280 lb 12.8 oz (127.4 kg)  09/04/21 283 lb 6.4 oz (128.5 kg)  08/21/21 288 lb 12.8 oz (131 kg)    Lab Results  Component Value Date   TSH 4.090 05/12/2020   Lab Results  Component Value Date   WBC 5.7 03/20/2021   HGB 14.7 03/20/2021   HCT 44.5 03/20/2021   MCV 86 03/20/2021   PLT 206 03/20/2021   Lab Results  Component Value Date   NA 141 03/20/2021   K 4.2 03/20/2021   CO2 25 03/20/2021   GLUCOSE 112 (H) 03/20/2021   BUN 17 03/20/2021   CREATININE 0.96 03/20/2021   BILITOT 0.5 03/20/2021   ALKPHOS 71 03/20/2021   AST 19 03/20/2021   ALT 8 03/20/2021   PROT 7.1 03/20/2021   ALBUMIN 4.1 03/20/2021   CALCIUM 9.3 03/20/2021   EGFR 83 03/20/2021   Lab Results  Component Value Date   CHOL 123 03/20/2021   Lab Results  Component Value Date   HDL 37 (L) 03/20/2021   Lab Results  Component Value Date   LDLCALC 67 03/20/2021   Lab Results  Component Value Date   TRIG 98  03/20/2021   Lab Results  Component Value Date   CHOLHDL 3.3 03/20/2021   Lab Results  Component Value Date   HGBA1C 6.0 (H) 05/12/2020      Assessment & Plan:   Problem List Items Addressed This Visit       Cardiovascular and Mediastinum   Atrial fibrillation (HCC) - Primary    Rate-controlled On Sotalol and Eliquis Follows up with Cardiologist       Relevant Orders   TSH   Essential hypertension    BP Readings from Last 1 Encounters:  09/25/21 128/88  Well-controlled with Amlodipine and Dyazide Counseled for compliance with the medications Advised DASH diet and moderate exercise/walking, at least 150 mins/week      Relevant Orders   TSH   CBC with Differential/Platelet     Musculoskeletal and Integument   Osteoarthrosis    Has Heberden and Bouchard nodes, likely OA of hands Considering he has possible psoriasis, will get x-ray of hands to look for signs of joint destruction -if pertinent, will refer to rheumatology       Relevant Orders   DG Hand Complete Left   DG Hand Complete Right     Other   HLD (hyperlipidemia)    On Simvastatin Check lipid profile before next visit       Relevant Orders   Lipid Profile   Prediabetes    Lab Results  Component Value Date   HGBA1C 6.0 (H) 05/12/2020  Diet-controlled      Relevant Orders   Hemoglobin A1c   CMP14+EGFR  Morbid obesity ([place])    With HTN, prediabetes and HLD Diet modification and moderate exercise advised       Vitamin D deficiency    Advised to take Vitamin 1000 IU QD       Relevant Orders   VITAMIN D 25 Hydroxy (Vit-D Deficiency, Fractures)   Chronic anticoagulation   Relevant Orders   CBC with Differential/Platelet   Other Visit Diagnoses     Benign prostatic hyperplasia with lower urinary tract symptoms, symptom details unspecified       Relevant Orders   PSA       No orders of the defined types were placed in this encounter.   Follow-up: Return in about 6 months  (around 03/28/2022) for A Fib and HTN.    Lindell Spar, MD

## 2021-09-25 NOTE — Assessment & Plan Note (Signed)
On Simvastatin Check lipid profile before next visit

## 2021-09-25 NOTE — Assessment & Plan Note (Signed)
With HTN, prediabetes and HLD Diet modification and moderate exercise advised

## 2021-09-25 NOTE — Assessment & Plan Note (Signed)
Rate-controlled On Sotalol and Eliquis Follows up with Cardiologist 

## 2021-09-25 NOTE — Patient Instructions (Signed)
Please continue taking medications as prescribed.  Please use Diclofenac gel for joint pain.  Please get X-ray of hands done at [facility].

## 2021-09-25 NOTE — Assessment & Plan Note (Signed)
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

## 2021-09-25 NOTE — Assessment & Plan Note (Signed)
Lab Results  Component Value Date   HGBA1C 6.0 (H) 05/12/2020   Diet-controlled

## 2021-09-25 NOTE — Assessment & Plan Note (Signed)
BP Readings from Last 1 Encounters:  09/25/21 128/88   Well-controlled with Amlodipine and Dyazide Counseled for compliance with the medications Advised DASH diet and moderate exercise/walking, at least 150 mins/week

## 2021-09-26 ENCOUNTER — Ambulatory Visit (HOSPITAL_COMMUNITY)
Admission: RE | Admit: 2021-09-26 | Discharge: 2021-09-26 | Disposition: A | Discharge status: Home / Self Care | Payer: Medicare Other | Source: Ambulatory Visit | Attending: Internal Medicine | Admitting: Internal Medicine

## 2021-09-26 DIAGNOSIS — M19041 Primary osteoarthritis, right hand: Secondary | ICD-10-CM | POA: Diagnosis not present

## 2021-09-26 DIAGNOSIS — M19042 Primary osteoarthritis, left hand: Secondary | ICD-10-CM | POA: Diagnosis present

## 2022-03-21 LAB — CMP14+EGFR
ALT: 8 IU/L (ref 0–44)
AST: 19 IU/L (ref 0–40)
Albumin/Globulin Ratio: 1.5 (ref 1.2–2.2)
Albumin: 4.3 g/dL (ref 3.8–4.8)
Alkaline Phosphatase: 70 IU/L (ref 44–121)
BUN/Creatinine Ratio: 21 (ref 10–24)
BUN: 18 mg/dL (ref 8–27)
Bilirubin Total: 0.7 mg/dL (ref 0.0–1.2)
CO2: 22 mmol/L (ref 20–29)
Calcium: 9.3 mg/dL (ref 8.6–10.2)
Chloride: 103 mmol/L (ref 96–106)
Creatinine, Ser: 0.86 mg/dL (ref 0.76–1.27)
Globulin, Total: 2.9 g/dL (ref 1.5–4.5)
Glucose: 114 mg/dL — ABNORMAL HIGH (ref 70–99)
Potassium: 4 mmol/L (ref 3.5–5.2)
Sodium: 141 mmol/L (ref 134–144)
Total Protein: 7.2 g/dL (ref 6.0–8.5)
eGFR: 90 mL/min/{1.73_m2} (ref >59)

## 2022-03-21 LAB — CBC WITH DIFFERENTIAL/PLATELET
Basophils Absolute: 0.1 10*3/uL (ref 0.0–0.2)
Basos: 1 %
EOS (ABSOLUTE): 0.1 10*3/uL (ref 0.0–0.4)
Eos: 2 %
Hematocrit: 45.1 % (ref 37.5–51.0)
Hemoglobin: 14.9 g/dL (ref 13.0–17.7)
Immature Grans (Abs): 0 10*3/uL (ref 0.0–0.1)
Immature Granulocytes: 0 %
Lymphocytes Absolute: 1.9 10*3/uL (ref 0.7–3.1)
Lymphs: 31 %
MCH: 28.6 pg (ref 26.6–33.0)
MCHC: 33 g/dL (ref 31.5–35.7)
MCV: 87 fL (ref 79–97)
Monocytes Absolute: 0.8 10*3/uL (ref 0.1–0.9)
Monocytes: 13 %
Neutrophils Absolute: 3.3 10*3/uL (ref 1.4–7.0)
Neutrophils: 53 %
Platelets: 204 10*3/uL (ref 150–450)
RBC: 5.21 x10E6/uL (ref 4.14–5.80)
RDW: 14.5 % (ref 11.6–15.4)
WBC: 6.2 10*3/uL (ref 3.4–10.8)

## 2022-03-21 LAB — LIPID PANEL
Chol/HDL Ratio: 3.8 ratio (ref 0.0–5.0)
Cholesterol, Total: 132 mg/dL (ref 100–199)
HDL: 35 mg/dL — ABNORMAL LOW (ref >39)
LDL Chol Calc (NIH): 75 mg/dL (ref 0–99)
Triglycerides: 124 mg/dL (ref 0–149)
VLDL Cholesterol Cal: 22 mg/dL (ref 5–40)

## 2022-03-21 LAB — HEMOGLOBIN A1C
Est. average glucose Bld gHb Est-mCnc: 128 mg/dL
Hgb A1c MFr Bld: 6.1 % — ABNORMAL HIGH (ref 4.8–5.6)

## 2022-03-21 LAB — TSH: TSH: 5.28 u[IU]/mL — ABNORMAL HIGH (ref 0.450–4.500)

## 2022-03-21 LAB — VITAMIN D 25 HYDROXY (VIT D DEFICIENCY, FRACTURES): Vit D, 25-Hydroxy: 46.9 ng/mL (ref 30.0–100.0)

## 2022-03-21 LAB — PSA: Prostate Specific Ag, Serum: 1.5 ng/mL (ref 0.0–4.0)

## 2022-03-28 ENCOUNTER — Encounter: Payer: Self-pay | Admitting: Internal Medicine

## 2022-03-28 ENCOUNTER — Ambulatory Visit (INDEPENDENT_AMBULATORY_CARE_PROVIDER_SITE_OTHER): Payer: Medicare Other | Admitting: Internal Medicine

## 2022-03-28 VITALS — BP 112/77 | HR 88 | Ht 72.0 in | Wt 285.2 lb

## 2022-03-28 DIAGNOSIS — L309 Dermatitis, unspecified: Secondary | ICD-10-CM

## 2022-03-28 DIAGNOSIS — E038 Other specified hypothyroidism: Secondary | ICD-10-CM | POA: Insufficient documentation

## 2022-03-28 DIAGNOSIS — I10 Essential (primary) hypertension: Secondary | ICD-10-CM

## 2022-03-28 DIAGNOSIS — E782 Mixed hyperlipidemia: Secondary | ICD-10-CM

## 2022-03-28 DIAGNOSIS — M159 Polyosteoarthritis, unspecified: Secondary | ICD-10-CM

## 2022-03-28 DIAGNOSIS — I480 Paroxysmal atrial fibrillation: Secondary | ICD-10-CM

## 2022-03-28 DIAGNOSIS — R7303 Prediabetes: Secondary | ICD-10-CM

## 2022-03-28 NOTE — Assessment & Plan Note (Addendum)
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

## 2022-03-28 NOTE — Assessment & Plan Note (Signed)
Rate-controlled On Sotalol and Eliquis Follows up with Cardiologist

## 2022-03-28 NOTE — Assessment & Plan Note (Signed)
Lab Results  Component Value Date   HGBA1C 6.1 (H) 03/20/2022   Diet-controlled

## 2022-03-28 NOTE — Progress Notes (Signed)
Established Patient Office Visit  Subjective:  Patient ID: Jeffery Sanders, male    DOB: 1945/05/21  Age: 76 y.o. MRN: [id number]  CC:  Chief Complaint  Patient presents with   Follow-up    6 month follow up    HPI Jeffery Sanders is a 76 y.o. male with past medical history of HTN, paroxysmal A Fib and HLD who presents for f/u of his chronic medical conditions.  HTN: He takes Amlodipine and Triamterene-HCTZ for HTN. BP is well-controlled. Takes medications regularly. Patient denies headache, dizziness, chest pain, dyspnea or palpitations.  He has a h/o paroxysmal A Fib, and takes Sotalol and Eliquis. He follows up with Cardiologist at [facility]. No h/o cardiac ablation.  He saw Dr. [place] Crane for rash on his hands and feet.  He was given CeraVe for psoriasis and Diprolene cream for itching and rash, which have helped up to certain extent.  He has chronic nodes on small joints of hands and has stiffness of the joints, but denies any severe pain.  He has deviated distal ends of fingers, which is chronic.   Past Medical History:  Diagnosis Date   Clotting disorder [facility])    Phreesia 04/18/2020   Diverticulosis of colon 09/21/2020   Hypertension    Phreesia 04/18/2020    Past Surgical History:  Procedure Laterality Date   EYE SURGERY N/A    Phreesia 04/18/2020   FRACTURE SURGERY N/A    Phreesia 04/18/2020   HERNIA REPAIR N/A    Phreesia 04/18/2020   SPINE SURGERY N/A    Phreesia 04/18/2020    History reviewed. No pertinent family history.  Social History   Socioeconomic History   Marital status: Single    Spouse name: Not on file   Number of children: Not on file   Years of education: Not on file   Highest education level: Not on file  Occupational History   Not on file  Tobacco Use   Smoking status: Never   Smokeless tobacco: Never  Substance and Sexual Activity   Alcohol use: Never   Drug use: Never   Sexual activity: Yes  Other Topics Concern   Not on file  Social History  Narrative   Not on file   Social Determinants of Health   Financial Resource Strain: Low Risk  (08/20/2021)   Overall Financial Resource Strain (CARDIA)    Difficulty of Paying Living Expenses: Not hard at all  Food Insecurity: No Food Insecurity (08/20/2021)   Hunger Vital Sign    Worried About Running Out of Food in the Last Year: Never true    [place] in the Last Year: Never true  Transportation Needs: No Transportation Needs (08/20/2021)   PRAPARE - Hydrologist (Medical): No    Lack of Transportation (Non-Medical): No  Physical Activity: Sufficiently Active (08/20/2021)   Exercise Vital Sign    Days of Exercise per Week: 7 days    Minutes of Exercise per Session: 40 min  Stress: No Stress Concern Present (08/20/2021)   [place]    Feeling of Stress : Not at all  Social Connections: Moderately Integrated (08/20/2021)   Social Connection and Isolation Panel [NHANES]    Frequency of Communication with Friends and Family: Three times a week    Frequency of Social Gatherings with Friends and Family: Three times a week    Attends Religious Services: 1 to 4 times per year  Active Member of Clubs or Organizations: No    Attends Archivist Meetings: Never    Marital Status: Living with partner  Intimate Partner Violence: Not At Risk (08/20/2021)   Humiliation, Afraid, Rape, and Kick questionnaire    Fear of Current or Ex-Partner: No    Emotionally Abused: No    Physically Abused: No    Sexually Abused: No    Outpatient Medications Prior to Visit  Medication Sig Dispense Refill   adapalene (DIFFERIN) 0.1 % cream Apply topically at bedtime. 45 g 0   amLODipine (NORVASC) 5 MG tablet Take by mouth.     apixaban (ELIQUIS) 5 MG TABS tablet Take by mouth.     augmented betamethasone dipropionate (DIPROLENE-AF) 0.05 % cream Apply topically.     clobetasol (TEMOVATE) 0.05 %  external solution Apply topically.     Coenzyme Q10 100 MG capsule Take by mouth.     fluticasone (CUTIVATE) 0.05 % cream Apply topically.     magnesium oxide (MAG-OX) 400 MG tablet Take by mouth.     simvastatin (ZOCOR) 20 MG tablet Take by mouth.     sotalol (BETAPACE) 120 MG tablet Take by mouth.     triamcinolone (KENALOG) 0.1 % APPLY TOPICALLY TO THE AFFECTED AREA TWICE DAILY 30 g 0   triamterene-hydrochlorothiazide (DYAZIDE) 37.5-25 MG capsule Take 1 capsule by mouth every morning.     clobetasol cream (TEMOVATE) 0.05 % Apply topically.     No facility-administered medications prior to visit.    Allergies  Allergen Reactions   No Known Allergies Hives   Tuberculin, Ppd Hives    ROS Review of Systems  Constitutional:  Negative for chills and fever.  HENT:  Negative for congestion, postnasal drip and sore throat.   Eyes:  Negative for pain and discharge.  Respiratory:  Negative for cough and shortness of breath.   Cardiovascular:  Negative for chest pain, palpitations and leg swelling.  Gastrointestinal:  Negative for abdominal pain, constipation, diarrhea and vomiting.  Genitourinary:  Negative for dysuria and hematuria.  Musculoskeletal:  Positive for arthralgias (L knee). Negative for neck pain and neck stiffness.  Skin:  Negative for rash.  Neurological:  Negative for dizziness and weakness.  Psychiatric/Behavioral:  Negative for agitation and behavioral problems.       Objective:    Physical Exam Vitals reviewed.  Constitutional:      General: He is not in acute distress.    Appearance: He is obese. He is not diaphoretic.  HENT:     Head: Normocephalic and atraumatic.     Nose: Nose normal.     Mouth/Throat:     Mouth: Mucous membranes are moist.  Eyes:     General: No scleral icterus.    Extraocular Movements: Extraocular movements intact.     Pupils: Pupils are equal, round, and reactive to light.  Cardiovascular:     Rate and Rhythm: Normal rate and  regular rhythm.     Pulses: Normal pulses.     Heart sounds: Normal heart sounds. No murmur heard. Pulmonary:     Breath sounds: Normal breath sounds. No wheezing or rales.  Musculoskeletal:     Cervical back: Neck supple. No tenderness.     Right lower leg: No edema.     Left lower leg: No edema.     Comments: Heberden and Bouchard nodes noted bilaterally with ulnar deviation  Skin:    General: Skin is warm.     Comments: Scaly patches and erythema  over b/l feet  Neurological:     General: No focal deficit present.     Mental Status: He is alert and oriented to person, place, and time.     Sensory: No sensory deficit.     Motor: No weakness.  Psychiatric:        Mood and Affect: Mood normal.        Behavior: Behavior normal.     BP 112/77 (BP Location: Right Arm, Patient Position: Sitting, Cuff Size: Large)   Pulse 88   Ht 6' (1.829 m)   Wt 285 lb 3.2 oz (129.4 kg)   SpO2 93%   BMI 38.68 kg/m  Wt Readings from Last 3 Encounters:  03/28/22 285 lb 3.2 oz (129.4 kg)  09/25/21 280 lb 12.8 oz (127.4 kg)  09/04/21 283 lb 6.4 oz (128.5 kg)    Lab Results  Component Value Date   TSH 5.280 (H) 03/20/2022   Lab Results  Component Value Date   WBC 6.2 03/20/2022   HGB 14.9 03/20/2022   HCT 45.1 03/20/2022   MCV 87 03/20/2022   PLT 204 03/20/2022   Lab Results  Component Value Date   NA 141 03/20/2022   K 4.0 03/20/2022   CO2 22 03/20/2022   GLUCOSE 114 (H) 03/20/2022   BUN 18 03/20/2022   CREATININE 0.86 03/20/2022   BILITOT 0.7 03/20/2022   ALKPHOS 70 03/20/2022   AST 19 03/20/2022   ALT 8 03/20/2022   PROT 7.2 03/20/2022   ALBUMIN 4.3 03/20/2022   CALCIUM 9.3 03/20/2022   EGFR 90 03/20/2022   Lab Results  Component Value Date   CHOL 132 03/20/2022   Lab Results  Component Value Date   HDL 35 (L) 03/20/2022   Lab Results  Component Value Date   LDLCALC 75 03/20/2022   Lab Results  Component Value Date   TRIG 124 03/20/2022   Lab Results   Component Value Date   CHOLHDL 3.8 03/20/2022   Lab Results  Component Value Date   HGBA1C 6.1 (H) 03/20/2022      Assessment & Plan:   Problem List Items Addressed This Visit       Cardiovascular and Mediastinum   Atrial fibrillation ([place])    Rate-controlled On Sotalol and Eliquis Follows up with Cardiologist      Essential hypertension - Primary    BP Readings from Last 1 Encounters:  03/28/22 112/77  Well-controlled with Amlodipine and Dyazide Counseled for compliance with the medications Advised DASH diet and moderate exercise/walking, at least 150 mins/week        Endocrine   Subclinical hypothyroidism    Lab Results  Component Value Date   TSH 5.280 (H) 03/20/2022  Asymptomatic Recheck TSH with free T4 after 6 weeks      Relevant Orders   TSH + free T4     Musculoskeletal and Integument   Eczema    Dry skin over palms with patches of erythema Followed by dermatology - has steroid creams Advised to apply lotion/moisturizer      Osteoarthrosis    Has Heberden and Bouchard nodes, 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   Follow-up: Return in about 6 months (around 09/26/2022).    Lindell Spar, MD

## 2022-03-28 NOTE — Assessment & Plan Note (Signed)
Lab Results  Component Value Date   TSH 5.280 (H) 03/20/2022   Asymptomatic Recheck TSH with free T4 after 6 weeks

## 2022-03-28 NOTE — Assessment & Plan Note (Signed)
BP Readings from Last 1 Encounters:  03/28/22 112/77   Well-controlled with Amlodipine and Dyazide Counseled for compliance with the medications Advised DASH diet and moderate exercise/walking, at least 150 mins/week

## 2022-03-28 NOTE — Assessment & Plan Note (Signed)
Dry skin over palms with patches of erythema Followed by dermatology - has steroid creams Advised to apply lotion/moisturizer

## 2022-03-28 NOTE — Patient Instructions (Signed)
Please continue taking medications as prescribed.  Please continue to follow low salt diet and ambulate as tolerated.  Please get blood test done after 6 weeks.  Please consider getting Shingrix and Tdap vaccine at local pharmacy.

## 2022-03-28 NOTE — Assessment & Plan Note (Signed)
On Simvastatin Checked lipid profile

## 2022-04-25 IMAGING — DX DG RIBS 2V*R*
4 series · 4 of 4 positions shown · non-contrast
Comparison: None

CLINICAL DATA: Chest wall pain, rule out rib fracture with history
of fall. Pain since yesterday in the 74-year-old male.

EXAM:
RIGHT RIBS - 2 VIEW

[rib pa]
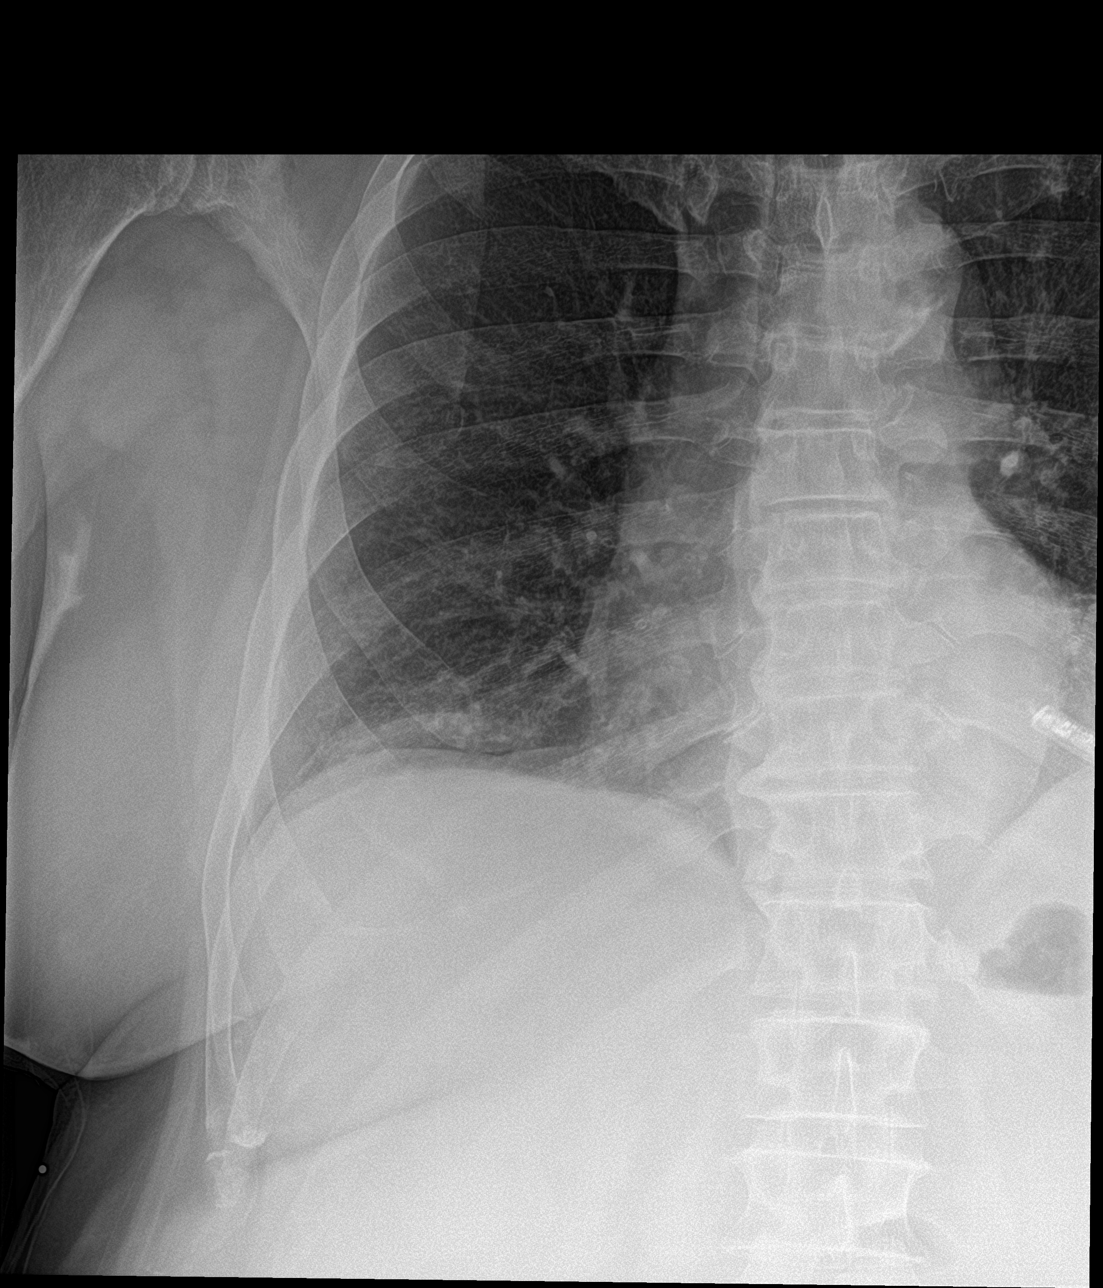

[rib pa obl (1 of 3)]
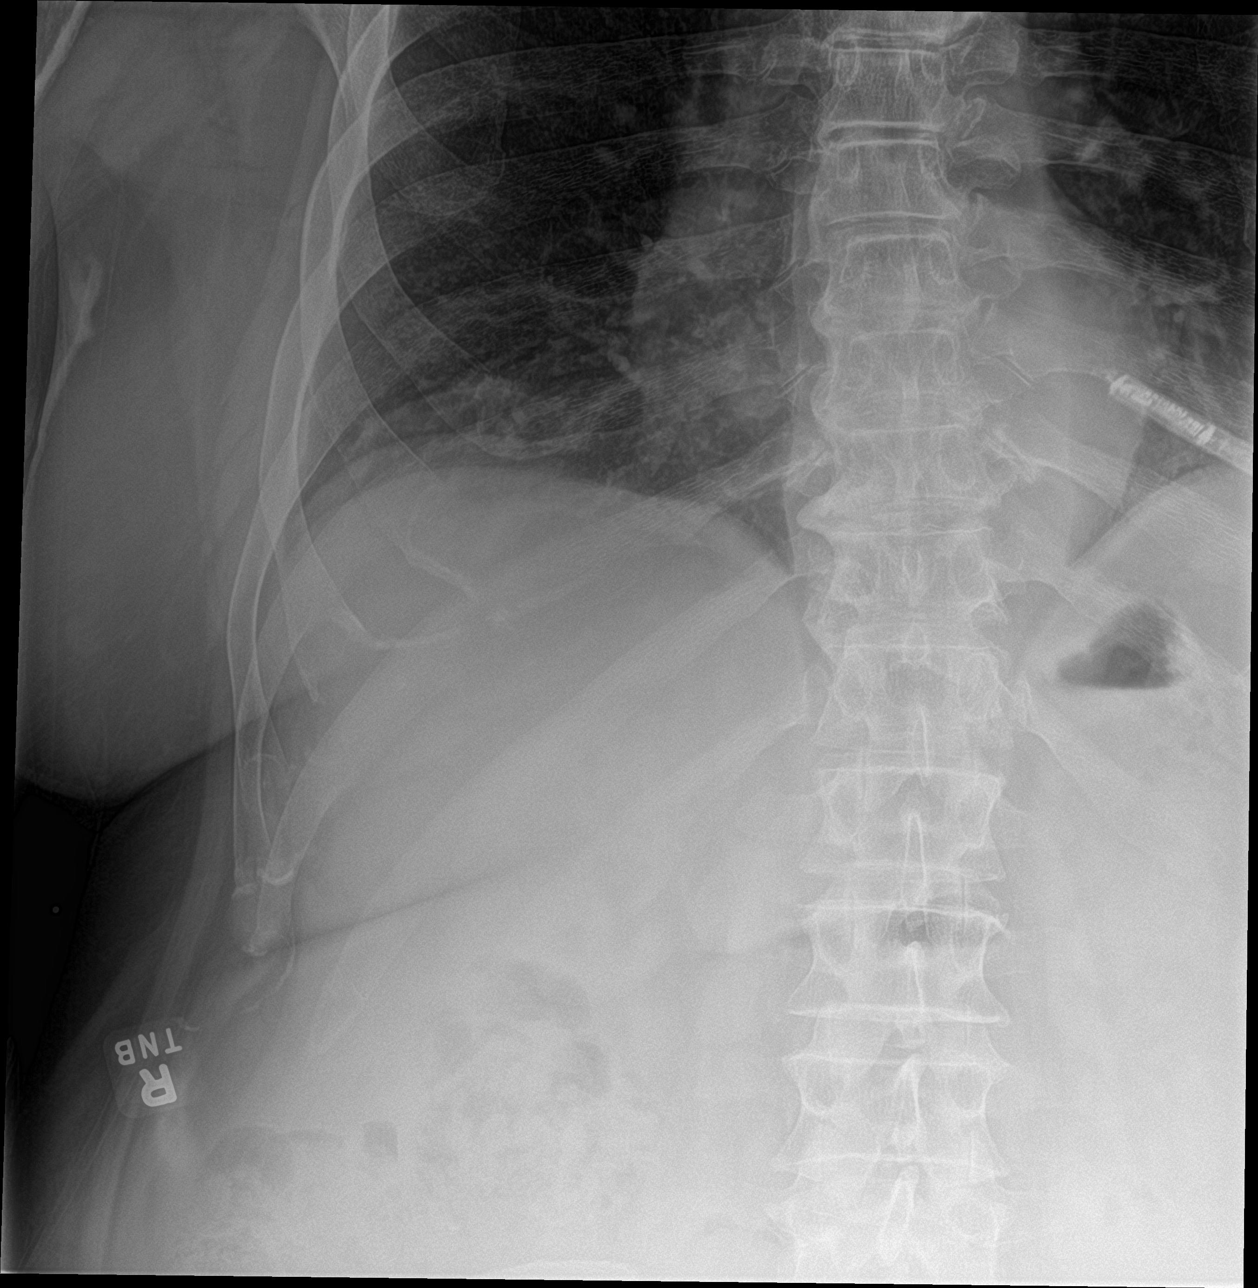

[rib pa obl (2 of 3)]
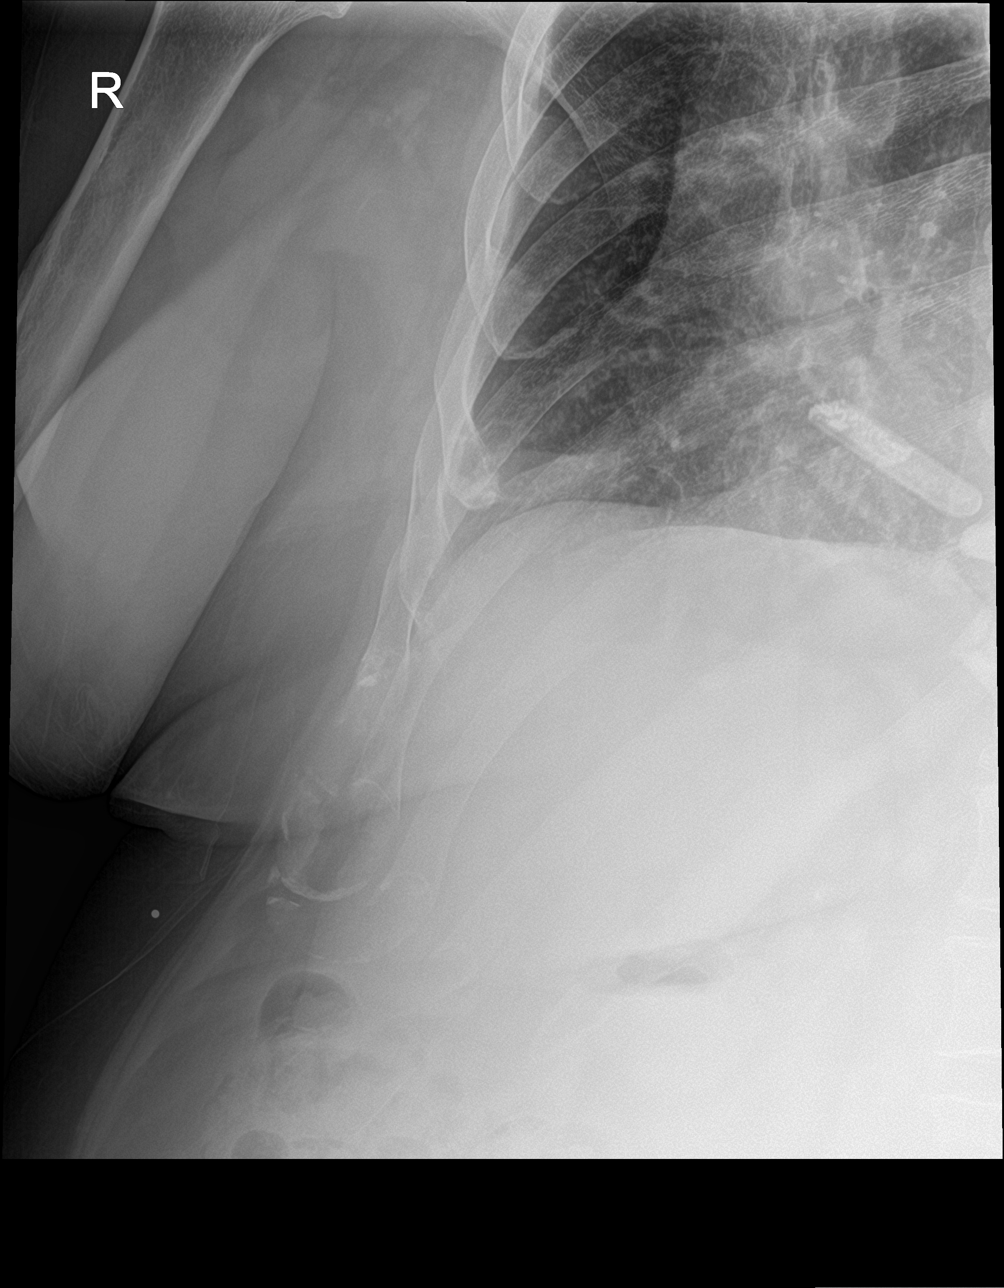

[rib pa obl (3 of 3)]
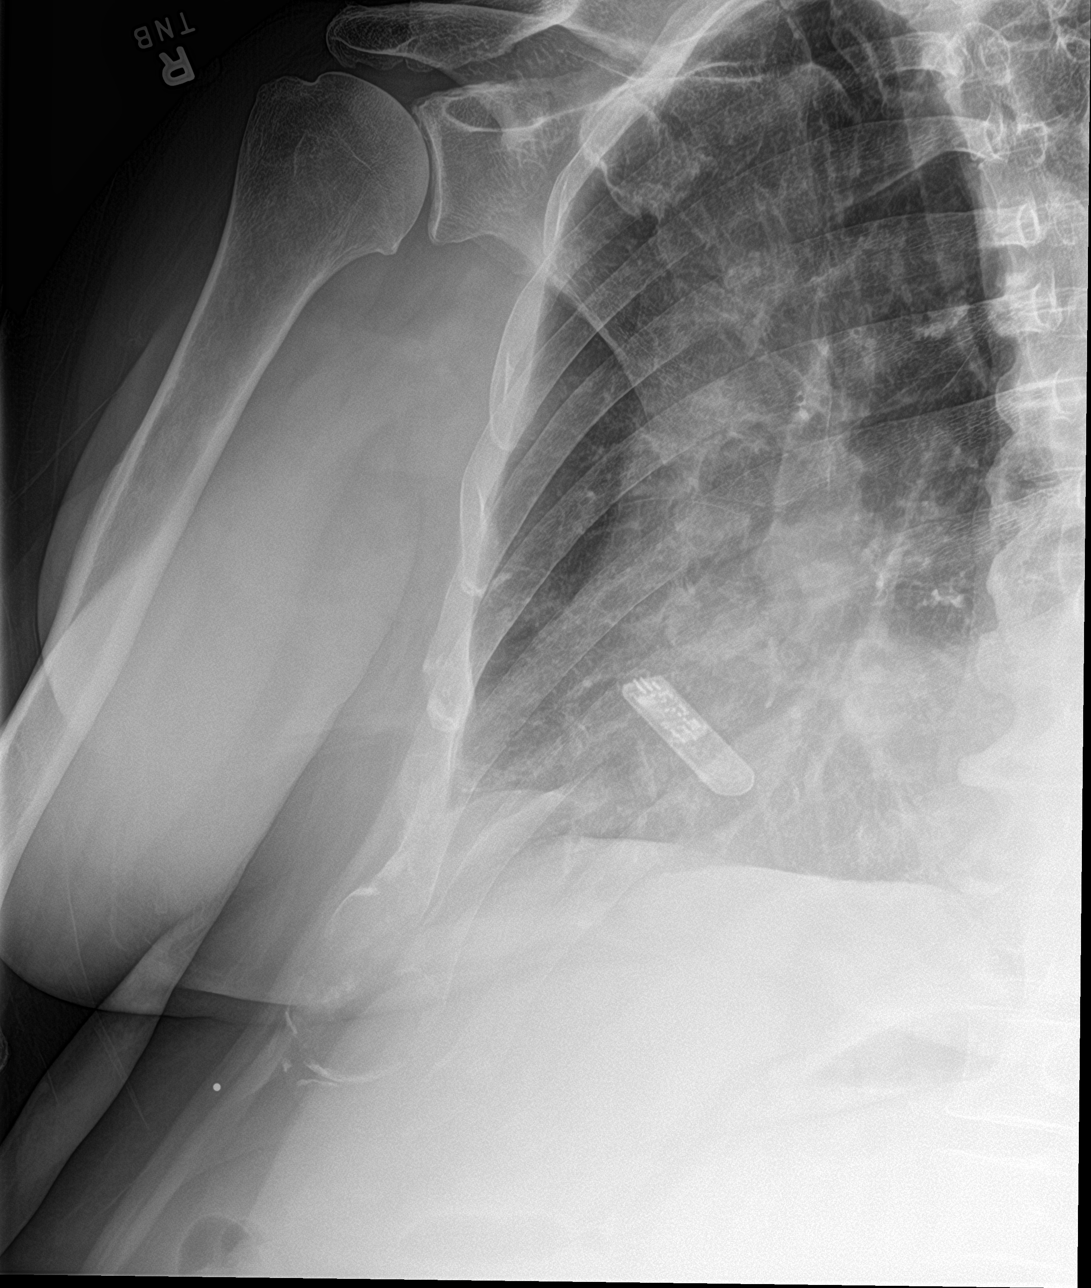

[4 of 4 positions shown; findings below may reference images not displayed]

FINDINGS: Incomplete imaging of the chest, exam including RIGHT ribs only a
portion of the LEFT chest incidentally imaged.

Mild basilar airspace disease over the RIGHT hemidiaphragm.

No sign of displaced rib fracture.

Cardiac loop recorder projects over the chest.
IMPRESSION: 1. No sign of displaced rib fracture.
2. Mild basilar airspace disease over the RIGHT hemidiaphragm. This
may represent atelectasis or developing infection. Chest is
incompletely imaged.

## 2022-05-14 LAB — TSH+FREE T4
Free T4: 0.98 ng/dL (ref 0.82–1.77)
TSH: 6.38 u[IU]/mL — ABNORMAL HIGH (ref 0.450–4.500)

## 2022-06-05 ENCOUNTER — Telehealth: Payer: Self-pay | Admitting: Internal Medicine

## 2022-06-05 NOTE — Telephone Encounter (Signed)
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 Please contact patient for more details about this.

## 2022-06-06 NOTE — Telephone Encounter (Signed)
Spoke to Jeffery Sanders  

## 2022-08-26 ENCOUNTER — Ambulatory Visit (INDEPENDENT_AMBULATORY_CARE_PROVIDER_SITE_OTHER): Payer: Medicare Other | Admitting: Internal Medicine

## 2022-08-26 ENCOUNTER — Encounter: Payer: Self-pay | Admitting: Internal Medicine

## 2022-08-26 DIAGNOSIS — Z0000 Encounter for general adult medical examination without abnormal findings: Secondary | ICD-10-CM

## 2022-08-26 NOTE — Patient Instructions (Signed)
  Jeffery Sanders , Thank you for taking time to come for your Medicare Wellness Visit. I appreciate your ongoing commitment to your health goals. Please review the following plan we discussed and let me know if I can assist you in the future.   These are the goals we discussed:  Goals      Patient Stated     Continue walking 1-2 miles daily as able. Wonderful job!        This is a list of the screening recommended for you and due dates:  Health Maintenance  Topic Date Due   Zoster (Shingles) Vaccine (1 of 2) Never done   DTaP/Tdap/Td vaccine (3 - Tdap) 10/06/2017   COVID-19 Vaccine (5 - 2023-24 season) 04/03/2022   Medicare Annual Wellness Visit  08/21/2022   Flu Shot  11/28/2022   Colon Cancer Screening  09/27/2029   Pneumonia Vaccine  Completed   Hepatitis C Screening: USPSTF Recommendation to screen - Ages 18-79 yo.  Completed   HPV Vaccine  Aged Out

## 2022-08-26 NOTE — Progress Notes (Signed)
Subjective:  This is a telephone encounter between Jeffery Sanders and Jeffery Sanders on 08/26/2022 for AWV. The visit was conducted with the patient located at home and Jeffery Sanders at [facility]. The patient's identity was confirmed using their DOB and current address. The patient has consented to being evaluated through a telephone encounter and understands the associated risks (an examination cannot be done and the patient may need to come in for an appointment) / benefits (allows the patient to remain at home, decreasing exposure to coronavirus).     Jeffery Sanders is a 77 y.o. male who presents for Medicare Annual/Subsequent preventive examination.  Review of Systems    Review of Systems  All other systems reviewed and are negative.   Objective:    There were no vitals filed for this visit. There is no height or weight on file to calculate BMI.     08/26/2022   11:06 AM 08/20/2021   10:12 AM 08/16/2020    1:45 PM  Advanced Directives  Does Patient Have a Medical Advance Directive? No No No  Does patient want to make changes to medical advance directive? Yes (ED - Information included in AVS)    Would patient like information on creating a medical advance directive?  Yes (ED - Information included in AVS) No - Patient declined    Current Medications (verified) Outpatient Encounter Medications as of 08/26/2022  Medication Sig   adapalene (DIFFERIN) 0.1 % cream Apply topically at bedtime.   amLODipine (NORVASC) 5 MG tablet Take by mouth.   apixaban (ELIQUIS) 5 MG TABS tablet Take by mouth.   augmented betamethasone dipropionate (DIPROLENE-AF) 0.05 % cream Apply topically.   clobetasol (TEMOVATE) 0.05 % external solution Apply topically.   Coenzyme Q10 100 MG capsule Take by mouth.   fluticasone (CUTIVATE) 0.05 % cream Apply topically.   magnesium oxide (MAG-OX) 400 MG tablet Take by mouth.   simvastatin (ZOCOR) 20 MG tablet Take by mouth.   sotalol (BETAPACE) 120 MG tablet Take by mouth.    triamcinolone (KENALOG) 0.1 % APPLY TOPICALLY TO THE AFFECTED AREA TWICE DAILY   triamterene-hydrochlorothiazide (DYAZIDE) 37.5-25 MG capsule Take 1 capsule by mouth every morning.   No facility-administered encounter medications on file as of 08/26/2022.    Allergies (verified) No known allergies and Tuberculin, ppd   History: Past Medical History:  Diagnosis Date   Clotting disorder (HCC)    Phreesia 04/18/2020   Diverticulosis of colon 09/21/2020   Hypertension    Phreesia 04/18/2020   Past Surgical History:  Procedure Laterality Date   EYE SURGERY N/A    Phreesia 04/18/2020   FRACTURE SURGERY N/A    Phreesia 04/18/2020   HERNIA REPAIR N/A    Phreesia 04/18/2020   SPINE SURGERY N/A    Phreesia 04/18/2020   No family history on file. Social History   Socioeconomic History   Marital status: Single    Spouse name: Not on file   Number of children: Not on file   Years of education: Not on file   Highest education level: Not on file  Occupational History   Not on file  Tobacco Use   Smoking status: Never   Smokeless tobacco: Never  Substance and Sexual Activity   Alcohol use: Never   Drug use: Never   Sexual activity: Yes  Other Topics Concern   Not on file  Social History Narrative   Not on file   Social Determinants of Health   Financial Resource Strain: Low Risk  (  08/20/2021)   Overall Financial Resource Strain (CARDIA)    Difficulty of Paying Living Expenses: Not hard at all  Food Insecurity: No Food Insecurity (08/20/2021)   Hunger Vital Sign    Worried About Running Out of Food in the Last Year: Never true    Ran Out of Food in the Last Year: Never true  Transportation Needs: No Transportation Needs (08/20/2021)   PRAPARE - Administrator, Civil Service (Medical): No    Lack of Transportation (Non-Medical): No  Physical Activity: Sufficiently Active (08/20/2021)   Exercise Vital Sign    Days of Exercise per Week: 7 days    Minutes of  Exercise per Session: 40 min  Stress: No Stress Concern Present (08/20/2021)   [employer] of Occupational Health - Occupational Stress Questionnaire    Feeling of Stress : Not at all  Social Connections: Moderately Integrated (08/20/2021)   Social Connection and Isolation Panel [NHANES]    Frequency of Communication with Friends and Family: Three times a week    Frequency of Social Gatherings with Friends and Family: Three times a week    Attends Religious Services: 1 to 4 times per year    Active Member of Clubs or Organizations: No    Attends Banker Meetings: Never    Marital Status: Living with partner    Tobacco Counseling Counseling given: Not Answered   Clinical Intake:  Pre-visit preparation completed: Yes  Pain : No/denies pain     Diabetes: No  How often do you need to have someone help you when you read instructions, pamphlets, or other written materials from your doctor or pharmacy?: 1 - Never What is the last grade level you completed in school?: Masters   Activities of Daily Living    08/26/2022   11:08 AM  In your present state of health, do you have any difficulty performing the following activities:  Hearing? 0  Vision? 0  Difficulty concentrating or making decisions? 0  Walking or climbing stairs? 0  Dressing or bathing? 0  Doing errands, shopping? 0    Patient Care Team: Anabel Halon, MD as PCP - General (Internal Medicine)  Indicate any recent Medical Services you may have received from other than [facility] providers in the past year (date may be approximate).     Assessment:   This is a routine wellness examination for Jeffery Sanders.  Hearing/Vision screen No results found.  Dietary issues and exercise activities discussed:     Goals Addressed   None    Depression Screen    08/26/2022   11:08 AM 03/28/2022    1:07 PM 09/25/2021    1:01 PM 09/04/2021    8:56 AM 08/21/2021    8:46 AM 08/20/2021   10:07 AM 03/26/2021   10:55  AM  PHQ 2/9 Scores  PHQ - 2 Score 0 0 0 0 0 0 0    Fall Risk    08/26/2022   11:08 AM 03/28/2022    1:07 PM 09/25/2021    1:00 PM 09/04/2021    8:55 AM 08/21/2021    8:45 AM  Fall Risk   Falls in the past year? 0 0 0 0 0  Number falls in past yr:  0 0 0 0  Injury with Fall?  0 0 0 0  Risk for fall due to :  No Fall Risks No Fall Risks No Fall Risks No Fall Risks  Follow up  Falls evaluation completed Falls evaluation completed Falls  evaluation completed Falls evaluation completed    FALL RISK PREVENTION PERTAINING TO THE HOME:  Any stairs in or around the home? Yes  If so, are there any without handrails? Yes  Home free of loose throw rugs in walkways, pet beds, electrical cords, etc? Yes  Adequate lighting in your home to reduce risk of falls? Yes   ASSISTIVE DEVICES UTILIZED TO PREVENT FALLS:  Life alert? No  Use of a cane, walker or w/c? No  Grab bars in the bathroom? No  Shower chair or bench in shower? Yes  Elevated toilet seat or a handicapped toilet? No     Cognitive Function:        08/26/2022   11:09 AM 08/20/2021   10:03 AM  6CIT Screen  What Year? 0 points 0 points  What month? 0 points 0 points  What time? 0 points 0 points  Count back from 20 0 points 0 points  Months in reverse 0 points 0 points  Repeat phrase 2 points 4 points  Total Score 2 points 4 points    Immunizations Immunization History  Administered Date(s) Administered   Fluad Quad(high Dose 65+) 12/29/2020, 02/06/2022   Influenza, Seasonal, Injecte, Preservative Fre 03/22/2015   Influenza,inj,Quad PF,6+ Mos 02/26/2016, 01/27/2017   Influenza-Unspecified 03/13/2001, 03/05/2004, 02/25/2005, 03/05/2007, 02/11/2008, 02/10/2009, 01/27/2010, 01/28/2011, 01/28/2012, 02/13/2013, 03/29/2013, 01/27/2020   Moderna Covid-19 Vaccine Bivalent Booster 58yrs & up 02/06/2022   Moderna Sars-Covid-2 Vaccination 05/14/2019, 06/12/2019, 02/16/2020   Pneumococcal Conjugate-13 07/26/2016, 08/30/2016    Pneumococcal Polysaccharide-23 09/01/2017   Pneumococcal-Unspecified 08/12/2012   Td 10/07/2007   Td (Adult),unspecified 10/07/2007    TDAP status: Due, Education has been provided regarding the importance of this vaccine. Advised may receive this vaccine at local pharmacy or Health Dept. Aware to provide a copy of the vaccination record if obtained from local pharmacy or Health Dept. Verbalized acceptance and understanding.  Flu Vaccine status: Up to date  Pneumococcal vaccine status: Up to date  Covid-19 vaccine status: Completed vaccines  Qualifies for Shingles Vaccine? Yes   Zostavax completed No   Shingrix Completed?: No.    Education has been provided regarding the importance of this vaccine. Patient has been advised to call insurance company to determine out of pocket expense if they have not yet received this vaccine. Advised may also receive vaccine at local pharmacy or Health Dept. Verbalized acceptance and understanding.  Screening Tests Health Maintenance  Topic Date Due   Zoster Vaccines- Shingrix (1 of 2) Never done   DTaP/Tdap/Td (3 - Tdap) 10/06/2017   COVID-19 Vaccine (5 - 2023-24 season) 04/03/2022   Medicare Annual Wellness (AWV)  08/21/2022   INFLUENZA VACCINE  11/28/2022   COLONOSCOPY (Pts 45-33yrs Insurance coverage will need to be confirmed)  09/27/2029   Pneumonia Vaccine 62+ Years old  Completed   Hepatitis C Screening  Completed   HPV VACCINES  Aged Out    Health Maintenance  Health Maintenance Due  Topic Date Due   Zoster Vaccines- Shingrix (1 of 2) Never done   DTaP/Tdap/Td (3 - Tdap) 10/06/2017   COVID-19 Vaccine (5 - 2023-24 season) 04/03/2022   Medicare Annual Wellness (AWV)  08/21/2022    Colorectal cancer screening: Type of screening: Colonoscopy. Completed 09/28/2019. Repeat every 5 years  Lung Cancer Screening: (Low Dose CT Chest recommended if Age 103-80 years, 30 pack-year currently smoking OR have quit w/in 15years.) does not qualify.     Additional Screening:  Hepatitis C Screening: does not qualify; Completed 03/20/2021  Vision  Screening: Recommended annual ophthalmology exams for early detection of glaucoma and other disorders of the eye. Is the patient up to date with their annual eye exam?  Yes  Who is the provider or what is the name of the office in which the patient attends annual eye exams?  [facility] - doesn't recall provider If pt is not established with a provider, would they like to be referred to a provider to establish care? No .   Dental Screening: Recommended annual dental exams for proper oral hygiene  Community Resource Referral / Chronic Care Management: CRR required this visit?  No   CCM required this visit?  No      Plan:     I have personally reviewed and noted the following in the patient's chart:   Medical and social history Use of alcohol, tobacco or illicit drugs  Current medications and supplements including opioid prescriptions. Patient is not currently taking opioid prescriptions. Functional ability and status Nutritional status Physical activity Advanced directives List of other physicians Hospitalizations, surgeries, and ER visits in previous 12 months Vitals Screenings to include cognitive, depression, and falls Referrals and appointments  In addition, I have reviewed and discussed with patient certain preventive protocols, quality metrics, and best practice recommendations. A written personalized care plan for preventive services as well as general preventive health recommendations were provided to patient.     Jeffery Banister, MD   08/26/2022

## 2022-09-26 ENCOUNTER — Ambulatory Visit: Payer: Medicare Other | Admitting: Internal Medicine

## 2022-09-30 ENCOUNTER — Encounter: Payer: Self-pay | Admitting: Internal Medicine

## 2022-09-30 ENCOUNTER — Ambulatory Visit (INDEPENDENT_AMBULATORY_CARE_PROVIDER_SITE_OTHER): Payer: Medicare Other | Admitting: Internal Medicine

## 2022-09-30 VITALS — BP 129/74 | HR 58 | Ht 72.0 in | Wt 299.8 lb

## 2022-09-30 DIAGNOSIS — E782 Mixed hyperlipidemia: Secondary | ICD-10-CM

## 2022-09-30 DIAGNOSIS — L409 Psoriasis, unspecified: Secondary | ICD-10-CM

## 2022-09-30 DIAGNOSIS — E038 Other specified hypothyroidism: Secondary | ICD-10-CM

## 2022-09-30 DIAGNOSIS — I10 Essential (primary) hypertension: Secondary | ICD-10-CM

## 2022-09-30 DIAGNOSIS — L309 Dermatitis, unspecified: Secondary | ICD-10-CM

## 2022-09-30 DIAGNOSIS — R7303 Prediabetes: Secondary | ICD-10-CM

## 2022-09-30 DIAGNOSIS — I480 Paroxysmal atrial fibrillation: Secondary | ICD-10-CM | POA: Diagnosis not present

## 2022-09-30 MED ORDER — CALCIPOTRIENE 0.005 % EX OINT
TOPICAL_OINTMENT | Freq: Two times a day (BID) | CUTANEOUS | 0 refills | Status: DC
Start: 2022-09-30 — End: 2022-10-28

## 2022-09-30 NOTE — Assessment & Plan Note (Signed)
On Simvastatin °Check lipid profile °

## 2022-09-30 NOTE — Assessment & Plan Note (Addendum)
BMI Readings from Last 3 Encounters:  09/30/22 40.66 kg/m  03/28/22 38.68 kg/m  09/25/21 38.08 kg/m   With HTN, prediabetes and HLD Diet modification and moderate exercise advised

## 2022-09-30 NOTE — Patient Instructions (Addendum)
Please start using Calcipotriene ointment on the palm.  Please continue to take medications as prescribed.  Please continue to follow low salt diet and perform moderate exercise/walking at least 150 mins/week.  Please get fasting blood tests done within a week.

## 2022-09-30 NOTE — Assessment & Plan Note (Signed)
Lab Results  Component Value Date   HGBA1C 6.1 (H) 03/20/2022   Diet-controlled 

## 2022-09-30 NOTE — Assessment & Plan Note (Signed)
Rate-controlled On Sotalol and Eliquis Follows up with Cardiologist 

## 2022-09-30 NOTE — Assessment & Plan Note (Signed)
BP Readings from Last 1 Encounters:  09/30/22 129/74   Well-controlled with Amlodipine and Dyazide Counseled for compliance with the medications Advised DASH diet and moderate exercise/walking, at least 150 mins/week

## 2022-09-30 NOTE — Progress Notes (Signed)
Established Patient Office Visit  Subjective:  Patient ID: Jeffery Sanders, male    DOB: 1945-08-03  Age: 77 y.o. MRN: [id number]  CC:  Chief Complaint  Patient presents with   Hypertension    Six month follow up. Patient would like another dermatologist recommendation.     HPI Jeffery Sanders is a 77 y.o. male with past medical history of HTN, paroxysmal A Fib and HLD who presents for f/u of his chronic medical conditions.  HTN: He takes Amlodipine and Triamterene-HCTZ for HTN. BP is well-controlled. Takes medications regularly. Patient denies headache, dizziness, chest pain, dyspnea or palpitations.  He has a h/o paroxysmal A Fib, and takes Sotalol and Eliquis. He follows up with Cardiologist at [facility]. No h/o cardiac ablation.  He saw Dr. Margo Aye for rash on his hands and feet.  He was given CeraVe for psoriasis and Diprolene cream for itching and rash, which have helped up to certain extent.  He still has erythema and skin peeling of the bilateral palms. He has chronic nodes on small joints of hands and has stiffness of the joints, but denies any severe pain.  He has deviated distal ends of fingers, which is chronic.   Past Medical History:  Diagnosis Date   Clotting disorder [facility])    Phreesia 04/18/2020   Diverticulosis of colon 09/21/2020   Hypertension    Phreesia 04/18/2020    Past Surgical History:  Procedure Laterality Date   EYE SURGERY N/A    Phreesia 04/18/2020   FRACTURE SURGERY N/A    Phreesia 04/18/2020   HERNIA REPAIR N/A    Phreesia 04/18/2020   SPINE SURGERY N/A    Phreesia 04/18/2020    History reviewed. No pertinent family history.  Social History   Socioeconomic History   Marital status: Single    Spouse name: Not on file   Number of children: Not on file   Years of education: Not on file   Highest education level: Not on file  Occupational History   Not on file  Tobacco Use   Smoking status: Never   Smokeless tobacco: Never  Substance and Sexual  Activity   Alcohol use: Never   Drug use: Never   Sexual activity: Yes  Other Topics Concern   Not on file  Social History Narrative   Not on file   Social Determinants of Health   Financial Resource Strain: Low Risk  (08/20/2021)   Overall Financial Resource Strain (CARDIA)    Difficulty of Paying Living Expenses: Not hard at all  Food Insecurity: No Food Insecurity (08/20/2021)   Hunger Vital Sign    Worried About Running Out of Food in the Last Year: Never true    Ran Out of Food in the Last Year: Never true  Transportation Needs: No Transportation Needs (08/20/2021)   PRAPARE - Administrator, Civil Service (Medical): No    Lack of Transportation (Non-Medical): No  Physical Activity: Sufficiently Active (08/20/2021)   Exercise Vital Sign    Days of Exercise per Week: 7 days    Minutes of Exercise per Session: 40 min  Stress: No Stress Concern Present (08/20/2021)   [employer] of Occupational Health - Occupational Stress Questionnaire    Feeling of Stress : Not at all  Social Connections: Moderately Integrated (08/20/2021)   Social Connection and Isolation Panel [NHANES]    Frequency of Communication with Friends and Family: Three times a week    Frequency of Social Gatherings with Friends and Family:  Three times a week    Attends Religious Services: 1 to 4 times per year    Active Member of Clubs or Organizations: No    Attends Banker Meetings: Never    Marital Status: Living with partner  Intimate Partner Violence: Not At Risk (08/20/2021)   Humiliation, Afraid, Rape, and Kick questionnaire    Fear of Current or Ex-Partner: No    Emotionally Abused: No    Physically Abused: No    Sexually Abused: No    Outpatient Medications Prior to Visit  Medication Sig Dispense Refill   Multiple Vitamins-Minerals (PRESERVISION AREDS 2 PO) Take 2 capsules by mouth daily.     Propylene Glycol (SYSTANE COMPLETE OP) Apply 4 drops to eye daily.      amLODipine (NORVASC) 5 MG tablet Take by mouth.     apixaban (ELIQUIS) 5 MG TABS tablet Take by mouth.     augmented betamethasone dipropionate (DIPROLENE-AF) 0.05 % cream Apply topically.     clobetasol (TEMOVATE) 0.05 % external solution Apply topically.     Coenzyme Q10 100 MG capsule Take by mouth.     fluticasone (CUTIVATE) 0.05 % cream Apply topically.     magnesium oxide (MAG-OX) 400 MG tablet Take by mouth.     simvastatin (ZOCOR) 20 MG tablet Take by mouth.     sotalol (BETAPACE) 120 MG tablet Take by mouth.     triamterene-hydrochlorothiazide (DYAZIDE) 37.5-25 MG capsule Take 1 capsule by mouth every morning.     adapalene (DIFFERIN) 0.1 % cream Apply topically at bedtime. 45 g 0   triamcinolone (KENALOG) 0.1 % APPLY TOPICALLY TO THE AFFECTED AREA TWICE DAILY 30 g 0   No facility-administered medications prior to visit.    Allergies  Allergen Reactions   No Known Allergies Hives   Tuberculin, Ppd Hives    ROS Review of Systems  Constitutional:  Negative for chills and fever.  HENT:  Negative for congestion, postnasal drip and sore throat.   Eyes:  Negative for pain and discharge.  Respiratory:  Negative for cough and shortness of breath.   Cardiovascular:  Negative for chest pain, palpitations and leg swelling.  Gastrointestinal:  Negative for abdominal pain, constipation, diarrhea and vomiting.  Genitourinary:  Negative for dysuria and hematuria.  Musculoskeletal:  Positive for arthralgias (L knee). Negative for neck pain and neck stiffness.  Skin:  Positive for rash.  Neurological:  Negative for dizziness and weakness.  Psychiatric/Behavioral:  Negative for agitation and behavioral problems.       Objective:    Physical Exam Vitals reviewed.  Constitutional:      General: He is not in acute distress.    Appearance: He is obese. He is not diaphoretic.  HENT:     Head: Normocephalic and atraumatic.     Nose: Nose normal.     Mouth/Throat:     Mouth: Mucous  membranes are moist.  Eyes:     General: No scleral icterus.    Extraocular Movements: Extraocular movements intact.     Pupils: Pupils are equal, round, and reactive to light.  Cardiovascular:     Rate and Rhythm: Normal rate and regular rhythm.     Pulses: Normal pulses.     Heart sounds: Normal heart sounds. No murmur heard. Pulmonary:     Breath sounds: Normal breath sounds. No wheezing or rales.  Musculoskeletal:     Cervical back: Neck supple. No tenderness.     Right lower leg: No edema.  Left lower leg: No edema.     Comments: Heberden and Bouchard nodes noted bilaterally with ulnar deviation  Skin:    General: Skin is warm.     Findings: Rash (Erythematous patches over bilateral palms, skin pilling) present.     Comments: Scaly patches and erythema over b/l feet  Neurological:     General: No focal deficit present.     Mental Status: He is alert and oriented to person, place, and time.     Sensory: No sensory deficit.     Motor: No weakness.  Psychiatric:        Mood and Affect: Mood normal.        Behavior: Behavior normal.     BP 129/74 (BP Location: Left Arm, Patient Position: Sitting, Cuff Size: Large)   Pulse (!) 58   Ht 6' (1.829 m)   Wt 299 lb 12.8 oz (136 kg)   SpO2 92%   BMI 40.66 kg/m  Wt Readings from Last 3 Encounters:  09/30/22 299 lb 12.8 oz (136 kg)  03/28/22 285 lb 3.2 oz (129.4 kg)  09/25/21 280 lb 12.8 oz (127.4 kg)    Lab Results  Component Value Date   TSH 6.380 (H) 05/13/2022   Lab Results  Component Value Date   WBC 6.2 03/20/2022   HGB 14.9 03/20/2022   HCT 45.1 03/20/2022   MCV 87 03/20/2022   PLT 204 03/20/2022   Lab Results  Component Value Date   NA 141 03/20/2022   K 4.0 03/20/2022   CO2 22 03/20/2022   GLUCOSE 114 (H) 03/20/2022   BUN 18 03/20/2022   CREATININE 0.86 03/20/2022   BILITOT 0.7 03/20/2022   ALKPHOS 70 03/20/2022   AST 19 03/20/2022   ALT 8 03/20/2022   PROT 7.2 03/20/2022   ALBUMIN 4.3  03/20/2022   CALCIUM 9.3 03/20/2022   EGFR 90 03/20/2022   Lab Results  Component Value Date   CHOL 132 03/20/2022   Lab Results  Component Value Date   HDL 35 (L) 03/20/2022   Lab Results  Component Value Date   LDLCALC 75 03/20/2022   Lab Results  Component Value Date   TRIG 124 03/20/2022   Lab Results  Component Value Date   CHOLHDL 3.8 03/20/2022   Lab Results  Component Value Date   HGBA1C 6.1 (H) 03/20/2022      Assessment & Plan:   Problem List Items Addressed This Visit       Cardiovascular and Mediastinum   Atrial fibrillation (HCC) - Primary    Rate-controlled On Sotalol and Eliquis Follows up with Cardiologist      Relevant Orders   CBC with Differential/Platelet   CMP14+EGFR   Essential hypertension    BP Readings from Last 1 Encounters:  09/30/22 129/74  Well-controlled with Amlodipine and Dyazide Counseled for compliance with the medications Advised DASH diet and moderate exercise/walking, at least 150 mins/week      Relevant Orders   CBC with Differential/Platelet   CMP14+EGFR     Endocrine   Subclinical hypothyroidism    Lab Results  Component Value Date   TSH 6.380 (H) 05/13/2022  Asymptomatic Recheck TSH with free T4      Relevant Orders   TSH + free T4     Musculoskeletal and Integument   Eczema    Dry skin over palms with patches of erythema Followed by dermatology - has steroid creams Advised to apply lotion/moisturizer      Relevant Orders  Ambulatory referral to Dermatology   Psoriasis    His hand erythema and skin peeling of appears to be due to psoriasis rather than eczema Started calcipotriene ointment Referred to [facility] dermatology clinic as per patient request      Relevant Medications   calcipotriene (DOVONOX) 0.005 % ointment     Other   HLD (hyperlipidemia)    On Simvastatin Check lipid profile      Relevant Orders   Lipid Profile   Prediabetes    Lab Results  Component Value Date    HGBA1C 6.1 (H) 03/20/2022  Diet-controlled      Relevant Orders   Hemoglobin A1c   Morbid obesity (HCC)    BMI Readings from Last 3 Encounters:  09/30/22 40.66 kg/m  03/28/22 38.68 kg/m  09/25/21 38.08 kg/m  With HTN, prediabetes and HLD Diet modification and moderate exercise advised      Meds ordered this encounter  Medications   calcipotriene (DOVONOX) 0.005 % ointment    Sig: Apply topically 2 (two) times daily.    Dispense:  60 g    Refill:  0    Follow-up: Return in about 6 months (around 04/01/2023) for HTN, A Fib and Psoriasis.    Anabel Halon, MD

## 2022-09-30 NOTE — Assessment & Plan Note (Signed)
Dry skin over palms with patches of erythema Followed by dermatology - has steroid creams Advised to apply lotion/moisturizer 

## 2022-09-30 NOTE — Assessment & Plan Note (Signed)
Lab Results  Component Value Date   TSH 6.380 (H) 05/13/2022   Asymptomatic Recheck TSH with free T4

## 2022-10-04 NOTE — Assessment & Plan Note (Signed)
His hand erythema and skin peeling of appears to be due to psoriasis rather than eczema Started calcipotriene ointment Referred to [facility] health dermatology clinic as per patient request

## 2022-10-09 LAB — TSH+FREE T4
Free T4: 1.11 ng/dL (ref 0.82–1.77)
TSH: 4.35 u[IU]/mL (ref 0.450–4.500)

## 2022-10-09 LAB — CMP14+EGFR
ALT: 9 IU/L (ref 0–44)
AST: 21 IU/L (ref 0–40)
Albumin/Globulin Ratio: 1.6
Albumin: 4.1 g/dL (ref 3.8–4.8)
Alkaline Phosphatase: 68 IU/L (ref 44–121)
BUN/Creatinine Ratio: 19 (ref 10–24)
BUN: 17 mg/dL (ref 8–27)
Bilirubin Total: 0.5 mg/dL (ref 0.0–1.2)
CO2: 24 mmol/L (ref 20–29)
Calcium: 9.2 mg/dL (ref 8.6–10.2)
Chloride: 105 mmol/L (ref 96–106)
Creatinine, Ser: 0.88 mg/dL (ref 0.76–1.27)
Globulin, Total: 2.6 g/dL (ref 1.5–4.5)
Glucose: 108 mg/dL — ABNORMAL HIGH (ref 70–99)
Potassium: 4.2 mmol/L (ref 3.5–5.2)
Sodium: 142 mmol/L (ref 134–144)
Total Protein: 6.7 g/dL (ref 6.0–8.5)
eGFR: 90 mL/min/{1.73_m2} (ref >59)

## 2022-10-09 LAB — CBC WITH DIFFERENTIAL/PLATELET
Basophils Absolute: 0.1 10*3/uL (ref 0.0–0.2)
Basos: 1 %
EOS (ABSOLUTE): 0.1 10*3/uL (ref 0.0–0.4)
Eos: 2 %
Hematocrit: 42.5 % (ref 37.5–51.0)
Hemoglobin: 14.1 g/dL (ref 13.0–17.7)
Immature Grans (Abs): 0 10*3/uL (ref 0.0–0.1)
Immature Granulocytes: 0 %
Lymphocytes Absolute: 1.8 10*3/uL (ref 0.7–3.1)
Lymphs: 33 %
MCH: 28.5 pg (ref 26.6–33.0)
MCHC: 33.2 g/dL (ref 31.5–35.7)
MCV: 86 fL (ref 79–97)
Monocytes Absolute: 0.7 10*3/uL (ref 0.1–0.9)
Monocytes: 13 %
Neutrophils Absolute: 2.8 10*3/uL (ref 1.4–7.0)
Neutrophils: 51 %
Platelets: 186 10*3/uL (ref 150–450)
RBC: 4.95 x10E6/uL (ref 4.14–5.80)
RDW: 14.1 % (ref 11.6–15.4)
WBC: 5.5 10*3/uL (ref 3.4–10.8)

## 2022-10-09 LAB — HEMOGLOBIN A1C
Est. average glucose Bld gHb Est-mCnc: 134 mg/dL
Hgb A1c MFr Bld: 6.3 % — ABNORMAL HIGH (ref 4.8–5.6)

## 2022-10-09 LAB — LIPID PANEL
Chol/HDL Ratio: 4.2 ratio (ref 0.0–5.0)
Cholesterol, Total: 135 mg/dL (ref 100–199)
HDL: 32 mg/dL — ABNORMAL LOW (ref >39)
LDL Chol Calc (NIH): 78 mg/dL (ref 0–99)
Triglycerides: 140 mg/dL (ref 0–149)
VLDL Cholesterol Cal: 25 mg/dL (ref 5–40)

## 2022-10-28 ENCOUNTER — Other Ambulatory Visit: Payer: Self-pay | Admitting: Internal Medicine

## 2022-10-28 DIAGNOSIS — L409 Psoriasis, unspecified: Secondary | ICD-10-CM

## 2022-12-09 ENCOUNTER — Telehealth: Payer: Self-pay | Admitting: Internal Medicine

## 2022-12-09 NOTE — Telephone Encounter (Signed)
Patient spouse called has not yet heard from the dermatologist. Patient asked for a call back [phone].

## 2022-12-20 ENCOUNTER — Telehealth: Payer: Self-pay | Admitting: Internal Medicine

## 2022-12-20 ENCOUNTER — Other Ambulatory Visit: Payer: Self-pay | Admitting: Internal Medicine

## 2022-12-20 DIAGNOSIS — L409 Psoriasis, unspecified: Secondary | ICD-10-CM

## 2022-12-20 MED ORDER — CALCIPOTRIENE 0.005 % EX OINT: TOPICAL_OINTMENT | Freq: Two times a day (BID) | CUTANEOUS | 3 refills | Status: DC

## 2022-12-20 NOTE — Telephone Encounter (Signed)
Prescription Request  12/20/2022  LOV: 09/30/2022  What is the name of the medication or equipment? calcipotriene (DOVONOX) 0.005 % ointment   Have you contacted your pharmacy to request a refill? Yes   Which pharmacy would you like this sent to?  [id number] - [place],  - [address] [place] [address] Phone: [phone] Fax: [phone]    Patient notified that their request is being sent to the clinical staff for review and that they should receive a response within 2 business days.   Please advise at Mobile [phone] (mobile)

## 2023-04-01 ENCOUNTER — Encounter: Payer: Self-pay | Admitting: Internal Medicine

## 2023-04-01 ENCOUNTER — Ambulatory Visit (INDEPENDENT_AMBULATORY_CARE_PROVIDER_SITE_OTHER): Payer: Medicare Other | Admitting: Internal Medicine

## 2023-04-01 VITALS — BP 127/76 | HR 62 | Ht 72.0 in | Wt 302.0 lb

## 2023-04-01 DIAGNOSIS — I480 Paroxysmal atrial fibrillation: Secondary | ICD-10-CM | POA: Diagnosis not present

## 2023-04-01 DIAGNOSIS — R7303 Prediabetes: Secondary | ICD-10-CM

## 2023-04-01 DIAGNOSIS — G8929 Other chronic pain: Secondary | ICD-10-CM

## 2023-04-01 DIAGNOSIS — E038 Other specified hypothyroidism: Secondary | ICD-10-CM | POA: Diagnosis not present

## 2023-04-01 DIAGNOSIS — Z7901 Long term (current) use of anticoagulants: Secondary | ICD-10-CM

## 2023-04-01 DIAGNOSIS — I10 Essential (primary) hypertension: Secondary | ICD-10-CM

## 2023-04-01 DIAGNOSIS — L409 Psoriasis, unspecified: Secondary | ICD-10-CM

## 2023-04-01 DIAGNOSIS — M25571 Pain in right ankle and joints of right foot: Secondary | ICD-10-CM

## 2023-04-01 DIAGNOSIS — E782 Mixed hyperlipidemia: Secondary | ICD-10-CM

## 2023-04-01 MED ORDER — CLOBETASOL PROPIONATE 0.05 % EX CREA
1.0000 | TOPICAL_CREAM | Freq: Two times a day (BID) | CUTANEOUS | 1 refills | Status: DC
Start: 2023-04-01 — End: 2023-04-29

## 2023-04-01 NOTE — Assessment & Plan Note (Signed)
BP Readings from Last 1 Encounters:  04/01/23 127/76   Well-controlled with Amlodipine and Dyazide Counseled for compliance with the medications Advised DASH diet and moderate exercise/walking, at least 150 mins/week

## 2023-04-01 NOTE — Progress Notes (Unsigned)
Established Patient Office Visit  Subjective:  Patient ID: Jeffery Sanders, male    DOB: 09-24-1945  Age: 77 y.o. MRN: [id number]  CC:  Chief Complaint  Patient presents with   Hypertension    Six month follow up    Atrial Fibrillation    Six month follow up     HPI Jeffery Sanders is a 77 y.o. male with past medical history of HTN, paroxysmal A Fib and HLD who presents for f/u of his chronic medical conditions.  HTN: He takes Amlodipine and Triamterene-HCTZ for HTN. BP is well-controlled. Takes medications regularly. Patient denies headache, dizziness, chest pain, dyspnea or palpitations.  He has a h/o paroxysmal A Fib, and takes Sotalol and Eliquis. He follows up with Cardiologist at [facility]. No h/o cardiac ablation.  He saw Dr. Margo Aye for rash on his hands and feet.  He was given CeraVe for psoriasis and Diprolene cream for itching and rash, which have helped up to certain extent.  He still has erythema and skin peeling of the bilateral palms. He has had better relief with Clobetasol cream, and requests refill of it. He has chronic nodes on small joints of hands and has stiffness of the joints, but denies any severe pain.  He has deviated distal ends of fingers, which is chronic.  He reports right ankle pain since 12/31/22, and has noticed that his foot remains deviated laterally as a favorable position.  He has discomfort around anterior part of ankle when he tries to keep the foot straight.  Denies any recent injury.  He noticed the right ankle while walking on uneven surface about a month ago.  Denies any numbness or tingling of the LE.  Past Medical History:  Diagnosis Date   Clotting disorder [facility])    Phreesia 04/18/2020   Diverticulosis of colon 09/21/2020   Hypertension    Phreesia 04/18/2020    Past Surgical History:  Procedure Laterality Date   EYE SURGERY N/A    Phreesia 04/18/2020   FRACTURE SURGERY N/A    Phreesia 04/18/2020   HERNIA REPAIR N/A    Phreesia 04/18/2020    SPINE SURGERY N/A    Phreesia 04/18/2020    History reviewed. No pertinent family history.  Social History   Socioeconomic History   Marital status: Single    Spouse name: Not on file   Number of children: Not on file   Years of education: Not on file   Highest education level: Not on file  Occupational History   Not on file  Tobacco Use   Smoking status: Never   Smokeless tobacco: Never  Substance and Sexual Activity   Alcohol use: Never   Drug use: Never   Sexual activity: Yes  Other Topics Concern   Not on file  Social History Narrative   Not on file   Social Determinants of Health   Financial Resource Strain: Low Risk  (08/20/2021)   Overall Financial Resource Strain (CARDIA)    Difficulty of Paying Living Expenses: Not hard at all  Food Insecurity: No Food Insecurity (08/20/2021)   Hunger Vital Sign    Worried About Running Out of Food in the Last Year: Never true    Ran Out of Food in the Last Year: Never true  Transportation Needs: No Transportation Needs (08/20/2021)   PRAPARE - Administrator, Civil Service (Medical): No    Lack of Transportation (Non-Medical): No  Physical Activity: Sufficiently Active (08/20/2021)   Exercise Vital Sign  Days of Exercise per Week: 7 days    Minutes of Exercise per Session: 40 min  Stress: No Stress Concern Present (08/20/2021)   [employer] of Occupational Health - Occupational Stress Questionnaire    Feeling of Stress : Not at all  Social Connections: Moderately Integrated (08/20/2021)   Social Connection and Isolation Panel [NHANES]    Frequency of Communication with Friends and Family: Three times a week    Frequency of Social Gatherings with Friends and Family: Three times a week    Attends Religious Services: 1 to 4 times per year    Active Member of Clubs or Organizations: No    Attends Banker Meetings: Never    Marital Status: Living with partner  Intimate Partner Violence: Not At  Risk (08/20/2021)   Humiliation, Afraid, Rape, and Kick questionnaire    Fear of Current or Ex-Partner: No    Emotionally Abused: No    Physically Abused: No    Sexually Abused: No    Outpatient Medications Prior to Visit  Medication Sig Dispense Refill   amLODipine (NORVASC) 5 MG tablet Take by mouth.     apixaban (ELIQUIS) 5 MG TABS tablet Take by mouth.     augmented betamethasone dipropionate (DIPROLENE-AF) 0.05 % cream Apply topically.     calcipotriene (DOVONOX) 0.005 % ointment Apply topically 2 (two) times daily. 60 g 3   Coenzyme Q10 100 MG capsule Take by mouth.     fluticasone (CUTIVATE) 0.05 % cream Apply topically.     magnesium oxide (MAG-OX) 400 MG tablet Take by mouth.     Multiple Vitamins-Minerals (PRESERVISION AREDS 2 PO) Take 2 capsules by mouth daily.     Propylene Glycol (SYSTANE COMPLETE OP) Apply 4 drops to eye daily.     simvastatin (ZOCOR) 20 MG tablet Take by mouth.     sotalol (BETAPACE) 120 MG tablet Take by mouth.     triamterene-hydrochlorothiazide (DYAZIDE) 37.5-25 MG capsule Take 1 capsule by mouth every morning.     clobetasol (TEMOVATE) 0.05 % external solution Apply topically.     No facility-administered medications prior to visit.    Allergies  Allergen Reactions   No Known Allergies Hives   Tuberculin, Ppd Hives    ROS Review of Systems  Constitutional:  Negative for chills and fever.  HENT:  Negative for congestion, postnasal drip and sore throat.   Eyes:  Negative for pain and discharge.  Respiratory:  Negative for cough and shortness of breath.   Cardiovascular:  Negative for chest pain, palpitations and leg swelling.  Gastrointestinal:  Negative for abdominal pain, constipation, diarrhea and vomiting.  Genitourinary:  Negative for dysuria and hematuria.  Musculoskeletal:  Positive for arthralgias (L knee). Negative for neck pain and neck stiffness.       R ankle, foot pain  Skin:  Positive for rash.  Neurological:  Negative for  dizziness and weakness.  Psychiatric/Behavioral:  Negative for agitation and behavioral problems.       Objective:    Physical Exam Vitals reviewed.  Constitutional:      General: He is not in acute distress.    Appearance: He is obese. He is not diaphoretic.  HENT:     Head: Normocephalic and atraumatic.     Nose: Nose normal.     Mouth/Throat:     Mouth: Mucous membranes are moist.  Eyes:     General: No scleral icterus.    Extraocular Movements: Extraocular movements intact.  Pupils: Pupils are equal, round, and reactive to light.  Cardiovascular:     Rate and Rhythm: Normal rate and regular rhythm.     Pulses: Normal pulses.     Heart sounds: Normal heart sounds. No murmur heard. Pulmonary:     Breath sounds: Normal breath sounds. No wheezing or rales.  Musculoskeletal:     Cervical back: Neck supple. No tenderness.     Right lower leg: No edema.     Left lower leg: No edema.     Right ankle: Tenderness (Anterior part) present. Decreased range of motion (Inversion and eversion limited).     Comments: Heberden and Bouchard nodes noted bilaterally with ulnar deviation  Skin:    General: Skin is warm.     Findings: Rash (Erythematous patches over bilateral palms, skin pilling) present.     Comments: Scaly patches and erythema over b/l feet  Neurological:     General: No focal deficit present.     Mental Status: He is alert and oriented to person, place, and time.     Sensory: No sensory deficit.     Motor: No weakness.  Psychiatric:        Mood and Affect: Mood normal.        Behavior: Behavior normal.     BP 127/76 (BP Location: Right Arm, Patient Position: Sitting, Cuff Size: Large)   Pulse 62   Ht 6' (1.829 m)   Wt (!) 302 lb (137 kg)   SpO2 95%   BMI 40.96 kg/m  Wt Readings from Last 3 Encounters:  04/01/23 (!) 302 lb (137 kg)  09/30/22 299 lb 12.8 oz (136 kg)  03/28/22 285 lb 3.2 oz (129.4 kg)    Lab Results  Component Value Date   TSH 4.350  10/08/2022   Lab Results  Component Value Date   WBC 7.2 04/01/2023   HGB 14.9 04/01/2023   HCT 46.1 04/01/2023   MCV 89 04/01/2023   PLT 215 04/01/2023   Lab Results  Component Value Date   NA 143 04/01/2023   K 4.4 04/01/2023   CO2 25 04/01/2023   GLUCOSE 77 04/01/2023   BUN 12 04/01/2023   CREATININE 0.79 04/01/2023   BILITOT 0.5 10/08/2022   ALKPHOS 68 10/08/2022   AST 21 10/08/2022   ALT 9 10/08/2022   PROT 6.7 10/08/2022   ALBUMIN 4.1 10/08/2022   CALCIUM 8.9 04/01/2023   EGFR 92 04/01/2023   Lab Results  Component Value Date   CHOL 135 10/08/2022   Lab Results  Component Value Date   HDL 32 (L) 10/08/2022   Lab Results  Component Value Date   LDLCALC 78 10/08/2022   Lab Results  Component Value Date   TRIG 140 10/08/2022   Lab Results  Component Value Date   CHOLHDL 4.2 10/08/2022   Lab Results  Component Value Date   HGBA1C 6.2 (H) 04/01/2023      Assessment & Plan:   Problem List Items Addressed This Visit       Cardiovascular and Mediastinum   Atrial fibrillation (HCC) - Primary    Rate-controlled On Sotalol and Eliquis Follows up with Cardiologist      Essential hypertension    BP Readings from Last 1 Encounters:  04/01/23 127/76   Well-controlled with Amlodipine and Dyazide Counseled for compliance with the medications Advised DASH diet and moderate exercise/walking, at least 150 mins/week        Endocrine   Subclinical hypothyroidism    Lab  Results  Component Value Date   TSH 4.350 10/08/2022   Asymptomatic Rechecked TSH with free T4        Musculoskeletal and Integument   Psoriasis    His hand erythema and skin peeling of appears to be due to psoriasis rather than eczema Started calcipotriene ointment Clobetasol cream for rash/itching Referred to [facility] health dermatology clinic as per patient request      Relevant Medications   clobetasol cream (TEMOVATE) 0.05 %     Other   HLD (hyperlipidemia)    On  Simvastatin Checked lipid profile      Prediabetes    Lab Results  Component Value Date   HGBA1C 6.3 (H) 10/08/2022   Diet-controlled      Relevant Orders   Basic Metabolic Panel (BMET) (Completed)   Hemoglobin A1c (Completed)   Chronic anticoagulation   Relevant Orders   CBC (Completed)   Chronic pain of right ankle    Unclear etiology, but could be ligament tear from remote injury Offered podiatry referral, but he prefers to wait for now Rest, leg elevation and ice application advised Tylenol as needed for pain       Meds ordered this encounter  Medications   clobetasol cream (TEMOVATE) 0.05 %    Sig: Apply 1 Application topically 2 (two) times daily.    Dispense:  30 g    Refill:  1    Follow-up: Return in about 6 months (around 09/30/2023) for HTN and A fib.Anabel Halon, MD

## 2023-04-01 NOTE — Patient Instructions (Signed)
Please use wrap bandage for knee stabilization. Perform leg elevation and apply ice for swelling.  Please continue to take medications as prescribed.  Please continue to follow low carb diet and perform moderate exercise/walking at least 150 mins/week.

## 2023-04-01 NOTE — Assessment & Plan Note (Signed)
Lab Results  Component Value Date   TSH 4.350 10/08/2022   Asymptomatic Rechecked TSH with free T4

## 2023-04-01 NOTE — Assessment & Plan Note (Signed)
Rate-controlled On Sotalol and Eliquis Follows up with Cardiologist 

## 2023-04-01 NOTE — Assessment & Plan Note (Signed)
Lab Results  Component Value Date   HGBA1C 6.3 (H) 10/08/2022   Diet-controlled

## 2023-04-02 DIAGNOSIS — G8929 Other chronic pain: Secondary | ICD-10-CM | POA: Insufficient documentation

## 2023-04-02 LAB — BASIC METABOLIC PANEL
BUN/Creatinine Ratio: 15 (ref 10–24)
BUN: 12 mg/dL (ref 8–27)
CO2: 25 mmol/L (ref 20–29)
Calcium: 8.9 mg/dL (ref 8.6–10.2)
Chloride: 102 mmol/L (ref 96–106)
Creatinine, Ser: 0.79 mg/dL (ref 0.76–1.27)
Glucose: 77 mg/dL (ref 70–99)
Potassium: 4.4 mmol/L (ref 3.5–5.2)
Sodium: 143 mmol/L (ref 134–144)
eGFR: 92 mL/min/{1.73_m2} (ref >59)

## 2023-04-02 LAB — HEMOGLOBIN A1C
Est. average glucose Bld gHb Est-mCnc: 131 mg/dL
Hgb A1c MFr Bld: 6.2 % — ABNORMAL HIGH (ref 4.8–5.6)

## 2023-04-02 LAB — CBC
Hematocrit: 46.1 % (ref 37.5–51.0)
Hemoglobin: 14.9 g/dL (ref 13.0–17.7)
MCH: 28.7 pg (ref 26.6–33.0)
MCHC: 32.3 g/dL (ref 31.5–35.7)
MCV: 89 fL (ref 79–97)
Platelets: 215 10*3/uL (ref 150–450)
RBC: 5.19 x10E6/uL (ref 4.14–5.80)
RDW: 13.8 % (ref 11.6–15.4)
WBC: 7.2 10*3/uL (ref 3.4–10.8)

## 2023-04-02 NOTE — Assessment & Plan Note (Signed)
Unclear etiology, but could be ligament tear from remote injury Offered podiatry referral, but he prefers to wait for now Rest, leg elevation and ice application advised Tylenol as needed for pain

## 2023-04-02 NOTE — Assessment & Plan Note (Signed)
His hand erythema and skin peeling of appears to be due to psoriasis rather than eczema Started calcipotriene ointment Clobetasol cream for rash/itching Referred to [facility] health dermatology clinic as per patient request

## 2023-04-02 NOTE — Assessment & Plan Note (Addendum)
On Simvastatin Checked lipid profile

## 2023-04-29 ENCOUNTER — Other Ambulatory Visit: Payer: Self-pay | Admitting: Internal Medicine

## 2023-04-29 ENCOUNTER — Telehealth: Payer: Self-pay

## 2023-04-29 DIAGNOSIS — L309 Dermatitis, unspecified: Secondary | ICD-10-CM

## 2023-04-29 DIAGNOSIS — L409 Psoriasis, unspecified: Secondary | ICD-10-CM

## 2023-04-29 MED ORDER — CLOBETASOL PROPIONATE 0.05 % EX CREA: 1.0000 | TOPICAL_CREAM | Freq: Two times a day (BID) | CUTANEOUS | 1 refills | Status: DC

## 2023-04-29 MED ORDER — FLUTICASONE PROPIONATE 0.05 % EX CREA: TOPICAL_CREAM | Freq: Two times a day (BID) | CUTANEOUS | 1 refills | Status: AC

## 2023-04-29 NOTE — Telephone Encounter (Signed)
 Copied from CRM [id number]. Topic: Clinical - Medication Refill >> Apr 29, 2023  3:51 PM Benton KIDD wrote: Most Recent Primary Care Visit:  Provider: TOBIE SUZZANE POUR  Department: [facility]  Visit Type: OFFICE VISIT  Date: 04/01/2023  Medication: clobetasol  cream (TEMOVATE ) 0.05 %  Has the patient contacted their pharmacy? Yes reach out to doctor (Agent: If no, request that the patient contact the pharmacy for the refill. If patient does not wish to contact the pharmacy document the reason why and proceed with request.) (Agent: If yes, when and what did the pharmacy advise?)  Is this the correct pharmacy for this prescription? Yes If no, delete pharmacy and type the correct one.  This is the patient's preferred pharmacy:  [id number] - [place], [place] - [address] [place] [address] Phone: [phone] Fax: [phone]   Has the prescription been filled recently?   Is the patient out of the medication?   Has the patient been seen for an appointment in the last year OR does the patient have an upcoming appointment?   Can we respond through MyChart?   Agent: Please be advised that Rx refills may take up to 3 business days. We ask that you follow-up with your pharmacy.

## 2023-04-29 NOTE — Telephone Encounter (Signed)
Patient advised.

## 2023-04-29 NOTE — Telephone Encounter (Signed)
 Copied from CRM [id number]. Topic: Clinical - Medication Refill >> Apr 28, 2023  4:35 PM Renea ORN wrote: Most Recent Primary Care Visit:  Provider: TOBIE SUZZANE POUR  Department: [facility]  Visit Type: OFFICE VISIT  Date: 04/01/2023  Medication: fluticasone  (CUTIVATE ) 0.05 % cream  Has the patient contacted their pharmacy? No (Agent: If no, request that the patient contact the pharmacy for the refill. If patient does not wish to contact the pharmacy document the reason why and proceed with request.) (Agent: If yes, when and what did the pharmacy advise?)  Is this the correct pharmacy for this prescription? Yes If no, delete pharmacy and type the correct one.  This is the patient's preferred pharmacy:  [id number] - [place], [place] - [address] [place] [address] Phone: [phone] Fax: [phone]   Has the prescription been filled recently? Yes  Is the patient out of the medication? Yes  Has the patient been seen for an appointment in the last year OR does the patient have an upcoming appointment? Yes  Can we respond through MyChart? No  Agent: Please be advised that Rx refills may take up to 3 business days. We ask that you follow-up with your pharmacy.

## 2023-05-01 ENCOUNTER — Ambulatory Visit: Payer: Medicare Other | Admitting: Dermatology

## 2023-05-30 ENCOUNTER — Telehealth: Payer: Self-pay | Admitting: Internal Medicine

## 2023-05-30 NOTE — Telephone Encounter (Signed)
Prescription Request  05/30/2023  LOV: 04/01/2023  What is the name of the medication or equipment? clobetasol cream (TEMOVATE) 0.05 % [[id number]  REQUESTED 2 TUBES AT ONE TIME     Have you contacted your pharmacy to request a refill? No   Which pharmacy would you like this sent to?   [id number] - [place], [place] - [address] [name] Phone: [phone] Fax: [phone]    Patient notified that their request is being sent to the clinical staff for review and that they should receive a response within 2 business days.   Please advise at Mobile [phone] (mobile)

## 2023-06-02 ENCOUNTER — Other Ambulatory Visit: Payer: Self-pay | Admitting: Internal Medicine

## 2023-06-02 DIAGNOSIS — L409 Psoriasis, unspecified: Secondary | ICD-10-CM

## 2023-06-02 MED ORDER — CLOBETASOL PROPIONATE 0.05 % EX CREA: 1.0000 | TOPICAL_CREAM | Freq: Two times a day (BID) | CUTANEOUS | 2 refills | Status: DC

## 2023-08-19 ENCOUNTER — Other Ambulatory Visit: Payer: Self-pay | Admitting: Internal Medicine

## 2023-08-19 DIAGNOSIS — L409 Psoriasis, unspecified: Secondary | ICD-10-CM

## 2023-08-27 ENCOUNTER — Ambulatory Visit (INDEPENDENT_AMBULATORY_CARE_PROVIDER_SITE_OTHER): Payer: Medicare Other

## 2023-08-27 VITALS — BP 125/60 | HR 62 | Ht 72.0 in | Wt 290.0 lb

## 2023-08-27 DIAGNOSIS — Z0000 Encounter for general adult medical examination without abnormal findings: Secondary | ICD-10-CM | POA: Diagnosis not present

## 2023-08-27 NOTE — Patient Instructions (Signed)
 Mr. Jeffery Sanders , Thank you for taking time to come for your Medicare Wellness Visit. I appreciate your ongoing commitment to your health goals. Please review the following plan we discussed and let me know if I can assist you in the future.   Referrals/Orders/Follow-Ups/Clinician Recommendations:      Next Medicare AWV: Aug 30, 2024 at 2:30 pm telephone visit.         Aim for 30 minutes of exercise or brisk walking, 6-8 glasses of water, and 5 servings of fruits and vegetables each day.   This is a list of the screening recommended for you and due dates:  Health Maintenance  Topic Date Due   Zoster (Shingles) Vaccine (1 of 2) Never done   DTaP/Tdap/Td vaccine (3 - Tdap) 10/06/2017   COVID-19 Vaccine (5 - 2024-25 season) 12/29/2022   Flu Shot  11/28/2023   Medicare Annual Wellness Visit  08/26/2024   Pneumonia Vaccine  Completed   Hepatitis C Screening  Completed   HPV Vaccine  Aged Out   Meningitis B Vaccine  Aged Out   Colon Cancer Screening  Discontinued    Advanced directives: (Declined) Advance directive discussed with you today. Even though you declined this today, please call our office should you change your mind, and we can give you the proper paperwork for you to fill out. Advance Care Planning is important because it:  [x]  Makes sure you receive the medical care that is consistent with your values, goals, and preferences  [x]  It provides guidance to your family and loved ones and it also reduces their decisional burden about whether or not they are making the right decisions based on what you want done  Follow the link provided in your after visit summary or read over the paperwork we have mailed to you to help you started getting your Advance Directives in place. If you need assistance in completing these, please reach out to us  so that we can help you!  Next Medicare Annual Wellness Visit scheduled for next year: yes  Understanding Your Risk for Falls Millions of people have  serious injuries from falls each year. It is important to understand your risk of falling. Talk with your health care provider about your risk and what you can do to lower it. If you do have a serious fall, make sure to tell your provider. Falling once raises your risk of falling again. How can falls affect me? Serious injuries from falls are common. These include: Broken bones, such as hip fractures. Head injuries, such as traumatic brain injuries (TBI) or concussions. A fear of falling can cause you to avoid activities and stay at home. This can make your muscles weaker and raise your risk for a fall. What can increase my risk? There are a number of risk factors that increase your risk for falling. The more risk factors you have, the higher your risk of falling. Serious injuries from a fall happen most often to people who are older than 77 years old. Teenagers and young adults ages 34-29 are also at higher risk. Common risk factors include: Weakness in the lower body. Being generally weak or confused due to long-term (chronic) illness. Dizziness or balance problems. Poor vision. Medicines that cause dizziness or drowsiness. These may include: Medicines for your blood pressure, heart, anxiety, insomnia, or swelling (edema). Pain medicines. Muscle relaxants. Other risk factors include: Drinking alcohol. Having had a fall in the past. Having foot pain or wearing improper footwear. Working at a dangerous  job. Having any of the following in your home: Tripping hazards, such as floor clutter or loose rugs. Poor lighting. Pets. Having dementia or memory loss. What actions can I take to lower my risk of falling?  Physical activity Stay physically fit. Do strength and balance exercises. Consider taking a regular class to build strength and balance. Yoga and tai chi are good options. Vision Have your eyes checked every year and your prescription for glasses or contacts updated as  needed. Shoes and walking aids Wear non-skid shoes. Wear shoes that have rubber soles and low heels. Do not wear high heels. Do not walk around the house in socks or slippers. Use a cane or walker as told by your provider. Home safety Attach secure railings on both sides of your stairs. Install grab bars for your bathtub, shower, and toilet. Use a non-skid mat in your bathtub or shower. Attach bath mats securely with double-sided, non-slip rug tape. Use good lighting in all rooms. Keep a flashlight near your bed. Make sure there is a clear path from your bed to the bathroom. Use night-lights. Do not use throw rugs. Make sure all carpeting is taped or tacked down securely. Remove all clutter from walkways and stairways, including extension cords. Repair uneven or broken steps and floors. Avoid walking on icy or slippery surfaces. Walk on the grass instead of on icy or slick sidewalks. Use ice melter to get rid of ice on walkways in the winter. Use a cordless phone. Questions to ask your health care provider Can you help me check my risk for a fall? Do any of my medicines make me more likely to fall? Should I take a vitamin D  supplement? What exercises can I do to improve my strength and balance? Should I make an appointment to have my vision checked? Do I need a bone density test to check for weak bones (osteoporosis)? Would it help to use a cane or a walker? Where to find more information Centers for Disease Control and Prevention, STEADI: TonerPromos.no Community-Based Fall Prevention Programs: TonerPromos.no [employer] on Aging: BaseRingTones.pl Contact a health care provider if: You fall at home. You are afraid of falling at home. You feel weak, drowsy, or dizzy. This information is not intended to replace advice given to you by your health care provider. Make sure you discuss any questions you have with your health care provider. Document Revised: 12/17/2021 Document Reviewed:  12/17/2021 Elsevier Patient Education  2024 [employer].

## 2023-08-27 NOTE — Progress Notes (Signed)
 Subjective:   Tajon Prestage is a 78 y.o. who presents for a Medicare Wellness preventive visit.  Visit Complete: Virtual I connected with  Festus Hubert on 08/27/23 by a audio enabled telemedicine application and verified that I am speaking with the correct person using two identifiers.  Patient Location: Home  Provider Location: Home Office  I discussed the limitations of evaluation and management by telemedicine. The patient expressed understanding and agreed to proceed.  Vital Signs: Because this visit was a virtual/telehealth visit, some criteria may be missing or patient reported. Any vitals not documented were not able to be obtained and vitals that have been documented are patient reported.  VideoDeclined- This patient declined Librarian, academic. Therefore the visit was completed with audio only.  Persons Participating in Visit: Patient.  AWV Questionnaire: No: Patient Medicare AWV questionnaire was not completed prior to this visit.  Cardiac Risk Factors include: advanced age (>42men, >28 women);hypertension;male gender;obesity (BMI >30kg/m2)     Objective:    Today's Vitals   08/27/23 0850  BP: 125/60  Pulse: 62  Weight: 290 lb (131.5 kg)  Height: 6' (1.829 m)  PainSc: 0-No pain   Body mass index is 39.33 kg/m.     08/27/2023    8:54 AM 08/26/2022   11:06 AM 08/20/2021   10:12 AM 08/16/2020    1:45 PM  Advanced Directives  Does Patient Have a Medical Advance Directive? No No No No  Does patient want to make changes to medical advance directive?  Yes (ED - Information included in AVS)    Would patient like information on creating a medical advance directive? No - Patient declined  Yes (ED - Information included in AVS) No - Patient declined    Current Medications (verified) Outpatient Encounter Medications as of 08/27/2023  Medication Sig   amLODipine (NORVASC) 5 MG tablet Take by mouth.   apixaban (ELIQUIS) 5 MG TABS tablet Take by  mouth.   augmented betamethasone dipropionate (DIPROLENE-AF) 0.05 % cream Apply topically.   calcipotriene  (DOVONOX) 0.005 % ointment Apply topically 2 (two) times daily.   clobetasol  cream (TEMOVATE ) 0.05 % APPLY TOPICALLY TO THE AFFECTED AREA TWICE DAILY   Coenzyme Q10 100 MG capsule Take by mouth.   magnesium oxide (MAG-OX) 400 MG tablet Take by mouth.   Multiple Vitamins-Minerals (PRESERVISION AREDS 2 PO) Take 2 capsules by mouth daily.   Propylene Glycol (SYSTANE COMPLETE OP) Apply 4 drops to eye daily.   simvastatin (ZOCOR) 20 MG tablet Take by mouth.   sotalol (BETAPACE) 120 MG tablet Take by mouth.   triamterene-hydrochlorothiazide (DYAZIDE) 37.5-25 MG capsule Take 1 capsule by mouth every morning.   No facility-administered encounter medications on file as of 08/27/2023.    Allergies (verified) No known allergies and Tuberculin, ppd   History: Past Medical History:  Diagnosis Date   Clotting disorder (HCC)    [employer]reesia 04/18/2020   Diverticulosis of colon 09/21/2020   Hypertension    [employer]reesia 04/18/2020   Past Surgical History:  Procedure Laterality Date   EYE SURGERY N/A    [employer]reesia 04/18/2020   FRACTURE SURGERY N/A    [employer]ia 04/18/2020   HERNIA REPAIR N/A    [employer]ia 04/18/2020   SPINE SURGERY N/A    [employer]reesia 04/18/2020   History reviewed. No pertinent family history. Social History   Socioeconomic History   Marital status: Married    Spouse name: Not on file   Number of children: Not on file   Years of education:  Not on file   Highest education level: Not on file  Occupational History   Not on file  Tobacco Use   Smoking status: Never   Smokeless tobacco: Never  Vaping Use   Vaping status: Never Used  Substance and Sexual Activity   Alcohol use: Never   Drug use: Never   Sexual activity: Yes  Other Topics Concern   Not on file  Social History Narrative   Not on file   Social Drivers of Health   Financial Resource Strain: Low Risk   (08/27/2023)   Overall Financial Resource Strain (CARDIA)    Difficulty of Paying Living Expenses: Not hard at all  Food Insecurity: No Food Insecurity (08/27/2023)   Hunger Vital Sign    Worried About Running Out of Food in the Last Year: Never true    Ran Out of Food in the Last Year: Never true  Transportation Needs: No Transportation Needs (08/27/2023)   PRAPARE - Administrator, Civil Service (Medical): No    Lack of Transportation (Non-Medical): No  Physical Activity: Sufficiently Active (08/27/2023)   Exercise Vital Sign    Days of Exercise per Week: 7 days    Minutes of Exercise per Session: 30 min  Stress: No Stress Concern Present (08/27/2023)   [employer] of Occupational Health - Occupational Stress Questionnaire    Feeling of Stress : Not at all  Social Connections: Moderately Isolated (08/27/2023)   Social Connection and Isolation Panel [NHANES]    Frequency of Communication with Friends and Family: More than three times a week    Frequency of Social Gatherings with Friends and Family: More than three times a week    Attends Religious Services: Never    Database administrator or Organizations: No    Attends Engineer, structural: Never    Marital Status: Married    Tobacco Counseling Counseling given: Yes    Clinical Intake:  Pre-visit preparation completed: Yes  Pain : No/denies pain Pain Score: 0-No pain     BMI - recorded: 39.33 Nutritional Risks: None Diabetes: No  Lab Results  Component Value Date   HGBA1C 6.2 (H) 04/01/2023   HGBA1C 6.3 (H) 10/08/2022   HGBA1C 6.1 (H) 03/20/2022     How often do you need to have someone help you when you read instructions, pamphlets, or other written materials from your doctor or pharmacy?: 1 - Never  Interpreter Needed?: No  Information entered by :: Sally Crazier CMA   Activities of Daily Living     08/27/2023    8:51 AM  In your present state of health, do you have any difficulty  performing the following activities:  Hearing? 0  Vision? 0  Difficulty concentrating or making decisions? 0  Walking or climbing stairs? 0  Dressing or bathing? 0  Doing errands, shopping? 0  Preparing Food and eating ? N  Using the Toilet? N  In the past six months, have you accidently leaked urine? N  Do you have problems with loss of bowel control? N  Managing your Medications? N  Managing your Finances? N  Housekeeping or managing your Housekeeping? N    Patient Care Team: Meldon Sport, MD as PCP - General (Internal Medicine) [facility], [place], [place] (Ophthalmology)  Indicate any recent Medical Services you may have received from other than [facility] providers in the past year (date may be approximate).     Assessment:   This is a routine wellness examination  for [employer].  Hearing/Vision screen Hearing Screening - Comments:: Patient has hearing aids but states he does not wear them because he &quot;can't handle all of that noise&quot; Vision Screening - Comments:: Patient wears reading glasses only. Up to date with yearly exams.  Patient sees [facility]    Goals Addressed             This Visit's Progress    Patient Stated       To remain active and healthy       Depression Screen     08/27/2023    8:56 AM 04/01/2023    1:01 PM 09/30/2022    1:37 PM 08/26/2022   11:08 AM 03/28/2022    1:07 PM 09/25/2021    1:01 PM 09/04/2021    8:56 AM  PHQ 2/9 Scores  PHQ - 2 Score 0 0 0 0 0 0 0  PHQ- 9 Score 0          Fall Risk     08/27/2023    8:55 AM 04/01/2023    1:01 PM 09/30/2022    1:37 PM 08/26/2022   11:08 AM 03/28/2022    1:07 PM  Fall Risk   Falls in the past year? 0 0 0 0 0  Number falls in past yr: 0 0 0  0  Injury with Fall? 0 0 0  0  Risk for fall due to : No Fall Risks No Fall Risks   No Fall Risks  Follow up Falls prevention discussed;Falls evaluation completed Falls evaluation completed   Falls evaluation completed    MEDICARE RISK AT  HOME:  Medicare Risk at Home Any stairs in or around the home?: No If so, are there any without handrails?: No Home free of loose throw rugs in walkways, pet beds, electrical cords, etc?: Yes Adequate lighting in your home to reduce risk of falls?: Yes Life alert?: No Use of a cane, walker or w/c?: No Grab bars in the bathroom?: Yes Shower chair or bench in shower?: Yes (has a walk in shower) Elevated toilet seat or a handicapped toilet?: No  TIMED UP AND GO:  Was the test performed?  No  Cognitive Function: 6CIT completed        08/27/2023    8:55 AM 08/26/2022   11:09 AM 08/20/2021   10:03 AM  6CIT Screen  What Year? 0 points 0 points 0 points  What month? 0 points 0 points 0 points  What time? 0 points 0 points 0 points  Count back from 20 0 points 0 points 0 points  Months in reverse 0 points 0 points 0 points  Repeat phrase 0 points 2 points 4 points  Total Score 0 points 2 points 4 points    Immunizations Immunization History  Administered Date(s) Administered   Fluad Quad(high Dose 65+) 12/29/2020, 02/06/2022   Influenza, Seasonal, Injecte, Preservative Fre 03/22/2015   Influenza,inj,Quad PF,6+ Mos 02/26/2016, 01/27/2017   Influenza-Unspecified 03/13/2001, 03/05/2004, 02/25/2005, 03/05/2007, 02/11/2008, 02/10/2009, 01/27/2010, 01/28/2011, 01/28/2012, 02/13/2013, 03/29/2013, 01/27/2020   Moderna Covid-19 Vaccine Bivalent Booster 37yrs & up 02/06/2022   Moderna Sars-Covid-2 Vaccination 05/14/2019, 06/12/2019, 02/16/2020   Pneumococcal Conjugate-13 07/26/2016, 08/30/2016   Pneumococcal Polysaccharide-23 09/01/2017   Pneumococcal-Unspecified 08/12/2012   Td 10/07/2007   Td (Adult),unspecified 10/07/2007    Screening Tests Health Maintenance  Topic Date Due   Zoster Vaccines- Shingrix (1 of 2) Never done   DTaP/Tdap/Td (3 - Tdap) 10/06/2017   COVID-19 Vaccine (5 - 2024-25 season) 12/29/2022  INFLUENZA VACCINE  11/28/2023   Medicare Annual Wellness (AWV)   08/26/2024   Pneumonia Vaccine 44+ Years old  Completed   Hepatitis C Screening  Completed   HPV VACCINES  Aged Out   Meningococcal B Vaccine  Aged Out   Colonoscopy  Discontinued    Health Maintenance  Health Maintenance Due  Topic Date Due   Zoster Vaccines- Shingrix (1 of 2) Never done   DTaP/Tdap/Td (3 - Tdap) 10/06/2017   COVID-19 Vaccine (5 - 2024-25 season) 12/29/2022   Health Maintenance Items Addressed: Patient advised of recommended vaccines and where he can have those given.   Additional Screening:  Vision Screening: Recommended annual ophthalmology exams for early detection of glaucoma and other disorders of the eye.  Dental Screening: Recommended annual dental exams for proper oral hygiene  Community Resource Referral / Chronic Care Management: CRR required this visit?  No   CCM required this visit?  No     Plan:     I have personally reviewed and noted the following in the patient's chart:   Medical and social history Use of alcohol, tobacco or illicit drugs  Current medications and supplements including opioid prescriptions. Patient is not currently taking opioid prescriptions. Functional ability and status Nutritional status Physical activity Advanced directives List of other physicians Hospitalizations, surgeries, and ER visits in previous 12 months Vitals Screenings to include cognitive, depression, and falls Referrals and appointments  In addition, I have reviewed and discussed with patient certain preventive protocols, quality metrics, and best practice recommendations. A written personalized care plan for preventive services as well as general preventive health recommendations were provided to patient.      Chrystle Murillo, CMA   08/27/2023   After Visit Summary: (Declined) Due to this being a telephonic visit, with patients personalized plan was offered to patient but patient Declined AVS at this time   Notes: Nothing significant to report at  this time.

## 2023-09-16 ENCOUNTER — Encounter: Payer: Self-pay | Admitting: Dermatology

## 2023-09-16 ENCOUNTER — Ambulatory Visit: Payer: Medicare Other | Admitting: Dermatology

## 2023-09-16 VITALS — BP 142/72 | HR 67

## 2023-09-16 DIAGNOSIS — L4050 Arthropathic psoriasis, unspecified: Secondary | ICD-10-CM | POA: Diagnosis not present

## 2023-09-16 DIAGNOSIS — L409 Psoriasis, unspecified: Secondary | ICD-10-CM

## 2023-09-16 MED ORDER — CLOBETASOL PROPIONATE 0.05 % EX CREA: TOPICAL_CREAM | CUTANEOUS | 2 refills | Status: DC

## 2023-09-16 NOTE — Patient Instructions (Addendum)
 Date: Sep 16, 2023  Hello Jeffery Sanders,  Thank you for visiting today. Here is a summary of the key instructions:  Diagnosis: Psoriasis  - Medications:   - Continue using clobetasol  cream for rash   - Use prescribed liquid for scalp psoriasis  - Planned Treatment:   - Cosentyx (secukinumab) injections for psoriasis  - Lab Tests:   - Complete TB and hepatitis screening   - Get a chest x-ray if TB test is positive  - Follow-up:   -  We will call you once approved   - Schedule a follow-up appointment in 4 months  - Education:   - Review provided written information about psoriasis  We look forward to seeing you at your next visit. If you have any questions or concerns before then, please do not hesitate to contact our office.  Warm regards,  Dr. Louana Roup, Dermatology   Important Information  Due to recent changes in healthcare laws, you may see results of your pathology and/or laboratory studies on MyChart before the doctors have had a chance to review them. We understand that in some cases there may be results that are confusing or concerning to you. Please understand that not all results are received at the same time and often the doctors may need to interpret multiple results in order to provide you with the best plan of care or course of treatment. Therefore, we ask that you please give us  2 business days to thoroughly review all your results before contacting the office for clarification. Should we see a critical lab result, you will be contacted sooner.   If You Need Anything After Your Visit  If you have any questions or concerns for your doctor, please call our main line at [phone] If no one answers, please leave a voicemail as directed and we will return your call as soon as possible. Messages left after 4 pm will be answered the following business day.   You may also send us  a message via MyChart. We typically respond to MyChart messages within 1-2 business  days.  For prescription refills, please ask your pharmacy to contact our office. Our fax number is [phone].  If you have an urgent issue when the clinic is closed that cannot wait until the next business day, you can page your doctor at the number below.    Please note that while we do our best to be available for urgent issues outside of office hours, we are not available 24/7.   If you have an urgent issue and are unable to reach us , you may choose to seek medical care at your doctor's office, retail clinic, urgent care center, or emergency room.  If you have a medical emergency, please immediately call 911 or go to the emergency department. In the event of inclement weather, please call our main line at [phone] for an update on the status of any delays or closures.  Dermatology Medication Tips: Please keep the boxes that topical medications come in in order to help keep track of the instructions about where and how to use these. Pharmacies typically print the medication instructions only on the boxes and not directly on the medication tubes.   If your medication is too expensive, please contact our office at [phone] or send us  a message through MyChart.   We are unable to tell what your co-pay for medications will be in advance as this is different depending on your insurance coverage. However, we may be able to  find a substitute medication at lower cost or fill out paperwork to get insurance to cover a needed medication.   If a prior authorization is required to get your medication covered by your insurance company, please allow us  1-2 business days to complete this process.  Drug prices often vary depending on where the prescription is filled and some pharmacies may offer cheaper prices.  The website www.goodrx.com contains coupons for medications through different pharmacies. The prices here do not account for what the cost may be with help from insurance (it may be  cheaper with your insurance), but the website can give you the price if you did not use any insurance.  - You can print the associated coupon and take it with your prescription to the pharmacy.  - You may also stop by our office during regular business hours and pick up a GoodRx coupon card.  - If you need your prescription sent electronically to a different pharmacy, notify our office through [facility] or by phone at [phone]

## 2023-09-16 NOTE — Progress Notes (Signed)
 New Patient Visit   Subjective  Jeffery Sanders is a 78 y.o. male who presents for the following: Eczema  Patient states he  has eczema located at the hands that he  would like to have examined. Patient reports the areas have been there for 2 years. He reports the areas are bothersome.Patient rates irritation 8 out of 10. He states that the areas have not spread. Patient reports he  has previously been treated for these areas. Patient reports that he applies clobetasol  ointment morning and night. Patient reports that it will clear up some with the clobetasol  ointment but comes right back.  The following portions of the chart were reviewed this encounter and updated as appropriate: medications, allergies, medical history  Review of Systems:  No other skin or systemic complaints except as noted in HPI or Assessment and Plan.  Objective  Well appearing patient in no apparent distress; mood and affect are within normal limits.  A focused examination was performed of the following areas:   Relevant exam findings are noted in the Assessment and Plan.       Assessment & Plan  PSORIASIS and Psoriatic Arthritis Exam: Well-demarcated erythematous papules/plaques with silvery scale, guttate pink scaly papules. 5% BSA.  notatgoal   patient admits to joint pain  Patient Education Discussed During Visit: Psoriasis is a chronic non-curable, but treatable genetic/hereditary disease that may have other systemic features affecting other organ systems such as joints (Psoriatic Arthritis). It is associated with an increased risk of inflammatory bowel disease, heart disease, non-alcoholic fatty liver disease, and depression.  Treatments include light and laser treatments; topical medications; and systemic medications including oral and injectables.  - Assessment:  Patient presents with erythematous psoriatic plaques on bilateral hands, occipital scalp, and guttate papules on forearms and posterior arms.  Associated dactylitis and inflammation of DIP joints indicate psoriatic arthritis. Previous treatments with topical corticosteroids, including clobetasol , have provided only temporary relief. The presentation and distribution of symptoms, along with joint involvement, support a diagnosis of psoriasis with psoriatic arthritis rather than eczema. Given the inadequate response to topical treatments and the risk of joint damage, systemic therapy is warranted.  - Plan:    Initiate Cosentyx (secukinumab) pending TB and hepatitis screening results     - Explained mechanism of action (IL-17 inhibitor) and benefits, including potential cardiovascular risk reduction     - Discussed expected onset of action (within one month)     - Informed consent obtained: discussed benefits, risks, and alternatives    Order TB screening test, hepatitis panel, and chest x-ray    Continue clobetasol  topically for symptomatic relief     - Prescribe larger tube for body use     - Prescribe liquid formulation for scalp application    Prescribe Cosentyx through specialty pharmacy once screening results are clear    Provide injection training once medication is approved    Schedule follow-up appointment in 4 months to assess treatment response    Patient to sign necessary forms for medication assistance program    Provide written information about psoriasis to patient  Pt is not a candidate for methotrexate or cyclosporine due to inability for follow up labs and contraindications with other medications. Pt has no access to a light box for phototherapy.  Due to the progressive and chronic nature of her psoriasis, patient has tried and failed numerous topicals creams as noted above, the next best therapeutic option is a systemic therapy. It is medically necessary to help improve  her quality of life.    Reviewed risks of biologics including immunosuppression, infections, injection site reaction, and failure to improve  condition. Goal is control of skin condition, not cure.  Some older biologics such as Humira and Enbrel may slightly increase risk of malignancy and may worsen congestive heart failure.  Taltz and Cosentyx may cause inflammatory bowel disease to flare. The use of biologics requires long term medication management, including periodic office visits and monitoring of blood work.   PSORIASIS   Related Procedures QuantiFERON-TB Gold Plus DG Chest 2 View Related Medications calcipotriene  (DOVONOX) 0.005 % ointment Apply topically 2 (two) times daily. clobetasol  cream (TEMOVATE ) 0.05 % Apply topically as directed.  No follow-ups on file.  Exie Holler, CMA, am acting as scribe for [employer], DO.   Documentation: I have reviewed the above documentation for accuracy and completeness, and I agree with the above.  Louana Roup, DO

## 2023-09-17 ENCOUNTER — Ambulatory Visit (HOSPITAL_COMMUNITY)
Admission: RE | Admit: 2023-09-17 | Discharge: 2023-09-17 | Disposition: A | Discharge status: Home / Self Care | Source: Ambulatory Visit | Attending: Dermatology | Admitting: Dermatology

## 2023-09-17 DIAGNOSIS — L409 Psoriasis, unspecified: Secondary | ICD-10-CM | POA: Diagnosis present

## 2023-09-21 LAB — QUANTIFERON-TB GOLD PLUS
Mitogen-NIL: 7.34 [IU]/mL
NIL: 0.01 [IU]/mL
QuantiFERON-TB Gold Plus: NEGATIVE
TB1-NIL: 0.01 [IU]/mL
TB2-NIL: 0.02 [IU]/mL

## 2023-09-23 ENCOUNTER — Ambulatory Visit (INDEPENDENT_AMBULATORY_CARE_PROVIDER_SITE_OTHER): Payer: Medicare Other | Admitting: Internal Medicine

## 2023-09-23 ENCOUNTER — Encounter: Payer: Self-pay | Admitting: Internal Medicine

## 2023-09-23 ENCOUNTER — Ambulatory Visit: Payer: Self-pay | Admitting: Dermatology

## 2023-09-23 VITALS — BP 114/70 | HR 62 | Ht 72.0 in | Wt 301.6 lb

## 2023-09-23 DIAGNOSIS — E782 Mixed hyperlipidemia: Secondary | ICD-10-CM

## 2023-09-23 DIAGNOSIS — I480 Paroxysmal atrial fibrillation: Secondary | ICD-10-CM | POA: Diagnosis not present

## 2023-09-23 DIAGNOSIS — Z7901 Long term (current) use of anticoagulants: Secondary | ICD-10-CM

## 2023-09-23 DIAGNOSIS — R7303 Prediabetes: Secondary | ICD-10-CM | POA: Diagnosis not present

## 2023-09-23 DIAGNOSIS — N401 Enlarged prostate with lower urinary tract symptoms: Secondary | ICD-10-CM

## 2023-09-23 DIAGNOSIS — I10 Essential (primary) hypertension: Secondary | ICD-10-CM | POA: Diagnosis not present

## 2023-09-23 DIAGNOSIS — L409 Psoriasis, unspecified: Secondary | ICD-10-CM

## 2023-09-23 DIAGNOSIS — E559 Vitamin D deficiency, unspecified: Secondary | ICD-10-CM

## 2023-09-23 NOTE — Assessment & Plan Note (Signed)
 Has hand erythema and skin peeling due to psoriasis Has calcipotriene  ointment Clobetasol  cream for rash/itching Followed by [facility]

## 2023-09-23 NOTE — Patient Instructions (Signed)
 Please continue to take medications as prescribed.  Please continue to follow low carb diet and perform moderate exercise/walking at least 150 mins/week.

## 2023-09-23 NOTE — Progress Notes (Unsigned)
 Established Patient Office Visit  Subjective:  Patient ID: Jeffery Sanders, male    DOB: 08-Sep-1945  Age: 78 y.o. MRN: [id number]  CC:  Chief Complaint  Patient presents with   Medical Management of Chronic Issues    6 month f/u, pt was dx with psoriasis was seen by dermatology last week.     HPI Jeffery Sanders is a 78 y.o. male with past medical history of HTN, paroxysmal A Fib and HLD who presents for f/u of his chronic medical conditions.  HTN: He takes Amlodipine and Triamterene-HCTZ for HTN. BP is well-controlled. Takes medications regularly. Patient denies headache, dizziness, chest pain, dyspnea or palpitations.  He has a h/o paroxysmal A Fib, and takes Sotalol and Eliquis. He follows up with Cardiologist at [facility]. No h/o cardiac ablation.  He saw Dr. Del Favia for rash on his hands and feet.  He was given CeraVe for psoriasis and Diprolene cream for itching and rash, which have helped up to certain extent.  He still has erythema and skin peeling of the bilateral palms. He has had better relief with Clobetasol  cream, and requests refill of it. He has chronic nodes on small joints of hands and has stiffness of the joints, but denies any severe pain.  He has deviated distal ends of fingers, which is chronic.  He reports right ankle pain since 12/31/22, and has noticed that his foot remains deviated laterally as a favorable position.  He has discomfort around anterior part of ankle when he tries to keep the foot straight.  Denies any recent injury.  He noticed the right ankle while walking on uneven surface about a month ago.  Denies any numbness or tingling of the LE.  Past Medical History:  Diagnosis Date   Clotting disorder [facility])    Phreesia 04/18/2020   Diverticulosis of colon 09/21/2020   Hypertension    Phreesia 04/18/2020    Past Surgical History:  Procedure Laterality Date   EYE SURGERY N/A    Phreesia 04/18/2020   FRACTURE SURGERY N/A    Phreesia 04/18/2020   HERNIA REPAIR N/A     Phreesia 04/18/2020   SPINE SURGERY N/A    Phreesia 04/18/2020    History reviewed. No pertinent family history.  Social History   Socioeconomic History   Marital status: Married    Spouse name: Not on file   Number of children: Not on file   Years of education: Not on file   Highest education level: Not on file  Occupational History   Not on file  Tobacco Use   Smoking status: Never   Smokeless tobacco: Never  Vaping Use   Vaping status: Never Used  Substance and Sexual Activity   Alcohol use: Never   Drug use: Never   Sexual activity: Yes  Other Topics Concern   Not on file  Social History Narrative   Not on file   Social Drivers of Health   Financial Resource Strain: Low Risk  (08/27/2023)   Overall Financial Resource Strain (CARDIA)    Difficulty of Paying Living Expenses: Not hard at all  Food Insecurity: No Food Insecurity (08/27/2023)   Hunger Vital Sign    Worried About Running Out of Food in the Last Year: Never true    Ran Out of Food in the Last Year: Never true  Transportation Needs: No Transportation Needs (08/27/2023)   PRAPARE - Administrator, Civil Service (Medical): No    Lack of Transportation (Non-Medical): No  Physical  Activity: Sufficiently Active (08/27/2023)   Exercise Vital Sign    Days of Exercise per Week: 7 days    Minutes of Exercise per Session: 30 min  Stress: No Stress Concern Present (08/27/2023)   [employer] of Occupational Health - Occupational Stress Questionnaire    Feeling of Stress : Not at all  Social Connections: Moderately Isolated (08/27/2023)   Social Connection and Isolation Panel [NHANES]    Frequency of Communication with Friends and Family: More than three times a week    Frequency of Social Gatherings with Friends and Family: More than three times a week    Attends Religious Services: Never    Database administrator or Organizations: No    Attends Banker Meetings: Never    Marital  Status: Married  Catering manager Violence: Not At Risk (08/27/2023)   Humiliation, Afraid, Rape, and Kick questionnaire    Fear of Current or Ex-Partner: No    Emotionally Abused: No    Physically Abused: No    Sexually Abused: No    Outpatient Medications Prior to Visit  Medication Sig Dispense Refill   amLODipine (NORVASC) 5 MG tablet Take 1 tablet by mouth daily.     apixaban (ELIQUIS) 5 MG TABS tablet Take 1 tablet by mouth every 12 (twelve) hours.     augmented betamethasone dipropionate (DIPROLENE-AF) 0.05 % cream Apply topically.     calcipotriene  (DOVONOX) 0.005 % ointment Apply topically 2 (two) times daily. 60 g 3   clobetasol  cream (TEMOVATE ) 0.05 % Apply topically as directed. 60 g 2   Coenzyme Q10 100 MG capsule Take by mouth.     magnesium oxide (MAG-OX) 400 MG tablet Take by mouth.     Multiple Vitamins-Minerals (PRESERVISION AREDS 2 PO) Take 2 capsules by mouth daily.     Propylene Glycol (SYSTANE COMPLETE OP) Apply 4 drops to eye daily.     simvastatin (ZOCOR) 20 MG tablet Take by mouth.     sotalol (BETAPACE) 120 MG tablet Take by mouth.     triamterene-hydrochlorothiazide (DYAZIDE) 37.5-25 MG capsule Take 1 capsule by mouth every morning.     amLODipine (NORVASC) 5 MG tablet Take by mouth.     apixaban (ELIQUIS) 5 MG TABS tablet Take by mouth.     No facility-administered medications prior to visit.    Allergies  Allergen Reactions   No Known Allergies Hives   Tuberculin, Ppd Hives    ROS Review of Systems  Constitutional:  Negative for chills and fever.  HENT:  Negative for congestion, postnasal drip and sore throat.   Eyes:  Negative for pain and discharge.  Respiratory:  Negative for cough and shortness of breath.   Cardiovascular:  Negative for chest pain, palpitations and leg swelling.  Gastrointestinal:  Negative for abdominal pain, constipation, diarrhea and vomiting.  Genitourinary:  Negative for dysuria and hematuria.  Musculoskeletal:  Positive  for arthralgias (L knee). Negative for neck pain and neck stiffness.       R ankle, foot pain  Skin:  Positive for rash.  Neurological:  Negative for dizziness and weakness.  Psychiatric/Behavioral:  Negative for agitation and behavioral problems.       Objective:    Physical Exam Vitals reviewed.  Constitutional:      General: He is not in acute distress.    Appearance: He is obese. He is not diaphoretic.  HENT:     Head: Normocephalic and atraumatic.     Nose: Nose normal.  Mouth/Throat:     Mouth: Mucous membranes are moist.  Eyes:     General: No scleral icterus.    Extraocular Movements: Extraocular movements intact.     Pupils: Pupils are equal, round, and reactive to light.  Cardiovascular:     Rate and Rhythm: Normal rate and regular rhythm.     Pulses: Normal pulses.     Heart sounds: Normal heart sounds. No murmur heard. Pulmonary:     Breath sounds: Normal breath sounds. No wheezing or rales.  Musculoskeletal:     Cervical back: Neck supple. No tenderness.     Right lower leg: No edema.     Left lower leg: No edema.     Right ankle: Tenderness (Anterior part) present. Decreased range of motion (Inversion and eversion limited).     Comments: Heberden and Bouchard nodes noted bilaterally with ulnar deviation  Skin:    General: Skin is warm.     Findings: Rash (Erythematous patches over bilateral palms, skin pilling) present.     Comments: Scaly patches and erythema over b/l feet  Neurological:     General: No focal deficit present.     Mental Status: He is alert and oriented to person, place, and time.     Sensory: No sensory deficit.     Motor: No weakness.  Psychiatric:        Mood and Affect: Mood normal.        Behavior: Behavior normal.     BP 114/70   Pulse 62   Ht 6' (1.829 m)   Wt (!) 301 lb 9.6 oz (136.8 kg)   SpO2 96%   BMI 40.90 kg/m  Wt Readings from Last 3 Encounters:  09/23/23 (!) 301 lb 9.6 oz (136.8 kg)  08/27/23 290 lb (131.5  kg)  04/01/23 (!) 302 lb (137 kg)    Lab Results  Component Value Date   TSH 4.350 10/08/2022   Lab Results  Component Value Date   WBC 7.2 04/01/2023   HGB 14.9 04/01/2023   HCT 46.1 04/01/2023   MCV 89 04/01/2023   PLT 215 04/01/2023   Lab Results  Component Value Date   NA 143 04/01/2023   K 4.4 04/01/2023   CO2 25 04/01/2023   GLUCOSE 77 04/01/2023   BUN 12 04/01/2023   CREATININE 0.79 04/01/2023   BILITOT 0.5 10/08/2022   ALKPHOS 68 10/08/2022   AST 21 10/08/2022   ALT 9 10/08/2022   PROT 6.7 10/08/2022   ALBUMIN 4.1 10/08/2022   CALCIUM 8.9 04/01/2023   EGFR 92 04/01/2023   Lab Results  Component Value Date   CHOL 135 10/08/2022   Lab Results  Component Value Date   HDL 32 (L) 10/08/2022   Lab Results  Component Value Date   LDLCALC 78 10/08/2022   Lab Results  Component Value Date   TRIG 140 10/08/2022   Lab Results  Component Value Date   CHOLHDL 4.2 10/08/2022   Lab Results  Component Value Date   HGBA1C 6.2 (H) 04/01/2023      Assessment & Plan:   Problem List Items Addressed This Visit   None    No orders of the defined types were placed in this encounter.   Follow-up: No follow-ups on file.    Meldon Sport, MD

## 2023-09-23 NOTE — Assessment & Plan Note (Signed)
 BP Readings from Last 1 Encounters:  09/23/23 114/70   Well-controlled with Amlodipine and Dyazide Counseled for compliance with the medications Advised DASH diet and moderate exercise/walking, at least 150 mins/week

## 2023-09-23 NOTE — Assessment & Plan Note (Signed)
Rate-controlled On Sotalol and Eliquis Follows up with Cardiologist 

## 2023-09-23 NOTE — Assessment & Plan Note (Signed)
 On Simvastatin Checked lipid profile

## 2023-09-23 NOTE — Assessment & Plan Note (Addendum)
 Lab Results  Component Value Date   HGBA1C 6.2 (H) 04/01/2023   Continue to follow low-carb diet

## 2023-09-25 ENCOUNTER — Ambulatory Visit: Payer: Self-pay | Admitting: Internal Medicine

## 2023-09-25 LAB — CBC WITH DIFFERENTIAL/PLATELET
Basophils Absolute: 0.1 10*3/uL (ref 0.0–0.2)
Basos: 1 %
EOS (ABSOLUTE): 0.2 10*3/uL (ref 0.0–0.4)
Eos: 2 %
Hematocrit: 43.5 % (ref 37.5–51.0)
Hemoglobin: 14 g/dL (ref 13.0–17.7)
Immature Grans (Abs): 0 10*3/uL (ref 0.0–0.1)
Immature Granulocytes: 1 %
Lymphocytes Absolute: 1.7 10*3/uL (ref 0.7–3.1)
Lymphs: 26 %
MCH: 28.7 pg (ref 26.6–33.0)
MCHC: 32.2 g/dL (ref 31.5–35.7)
MCV: 89 fL (ref 79–97)
Monocytes Absolute: 0.8 10*3/uL (ref 0.1–0.9)
Monocytes: 12 %
Neutrophils Absolute: 3.8 10*3/uL (ref 1.4–7.0)
Neutrophils: 58 %
Platelets: 199 10*3/uL (ref 150–450)
RBC: 4.87 x10E6/uL (ref 4.14–5.80)
RDW: 13.9 % (ref 11.6–15.4)
WBC: 6.6 10*3/uL (ref 3.4–10.8)

## 2023-09-25 LAB — CMP14+EGFR
ALT: 9 IU/L (ref 0–44)
AST: 20 IU/L (ref 0–40)
Albumin: 4.2 g/dL (ref 3.8–4.8)
Alkaline Phosphatase: 68 IU/L (ref 44–121)
BUN/Creatinine Ratio: 19 (ref 10–24)
BUN: 16 mg/dL (ref 8–27)
Bilirubin Total: 0.4 mg/dL (ref 0.0–1.2)
CO2: 24 mmol/L (ref 20–29)
Calcium: 8.9 mg/dL (ref 8.6–10.2)
Chloride: 103 mmol/L (ref 96–106)
Creatinine, Ser: 0.84 mg/dL (ref 0.76–1.27)
Globulin, Total: 2.5 g/dL (ref 1.5–4.5)
Glucose: 106 mg/dL — ABNORMAL HIGH (ref 70–99)
Potassium: 4.3 mmol/L (ref 3.5–5.2)
Sodium: 141 mmol/L (ref 134–144)
Total Protein: 6.7 g/dL (ref 6.0–8.5)
eGFR: 90 mL/min/{1.73_m2} (ref >59)

## 2023-09-25 LAB — HEMOGLOBIN A1C
Est. average glucose Bld gHb Est-mCnc: 126 mg/dL
Hgb A1c MFr Bld: 6 % — ABNORMAL HIGH (ref 4.8–5.6)

## 2023-09-25 LAB — LIPID PANEL
Chol/HDL Ratio: 3.6 ratio (ref 0.0–5.0)
Cholesterol, Total: 127 mg/dL (ref 100–199)
HDL: 35 mg/dL — ABNORMAL LOW (ref >39)
LDL Chol Calc (NIH): 73 mg/dL (ref 0–99)
Triglycerides: 98 mg/dL (ref 0–149)
VLDL Cholesterol Cal: 19 mg/dL (ref 5–40)

## 2023-09-25 LAB — VITAMIN D 25 HYDROXY (VIT D DEFICIENCY, FRACTURES): Vit D, 25-Hydroxy: 43.7 ng/mL (ref 30.0–100.0)

## 2023-09-25 LAB — PSA: Prostate Specific Ag, Serum: 1.9 ng/mL (ref 0.0–4.0)

## 2023-09-25 LAB — TSH: TSH: 4.12 u[IU]/mL (ref 0.450–4.500)

## 2023-09-26 ENCOUNTER — Encounter: Payer: Self-pay | Admitting: Internal Medicine

## 2023-09-26 NOTE — Assessment & Plan Note (Signed)
Advised to take Vitamin 1000 IU QD

## 2023-09-30 ENCOUNTER — Ambulatory Visit: Payer: Medicare Other | Admitting: Internal Medicine

## 2023-10-16 ENCOUNTER — Other Ambulatory Visit: Payer: Self-pay

## 2023-10-16 DIAGNOSIS — L409 Psoriasis, unspecified: Secondary | ICD-10-CM

## 2023-10-16 DIAGNOSIS — L4050 Arthropathic psoriasis, unspecified: Secondary | ICD-10-CM

## 2023-10-16 MED ORDER — COSENTYX UNOREADY 300 MG/2ML ~~LOC~~ SOAJ: 300.0000 mg | SUBCUTANEOUS | 4 refills | Status: DC

## 2023-10-16 MED ORDER — COSENTYX UNOREADY 300 MG/2ML ~~LOC~~ SOAJ: 300.0000 mg | SUBCUTANEOUS | 11 refills | Status: DC

## 2023-11-06 ENCOUNTER — Other Ambulatory Visit: Payer: Self-pay

## 2023-11-19 ENCOUNTER — Other Ambulatory Visit: Payer: Self-pay

## 2023-11-19 DIAGNOSIS — L409 Psoriasis, unspecified: Secondary | ICD-10-CM

## 2023-11-19 DIAGNOSIS — L4050 Arthropathic psoriasis, unspecified: Secondary | ICD-10-CM

## 2023-11-24 ENCOUNTER — Other Ambulatory Visit: Payer: Self-pay

## 2023-11-24 MED ORDER — TALTZ 80 MG/ML ~~LOC~~ SOAJ: 80.0000 mg | SUBCUTANEOUS | 11 refills | Status: DC

## 2023-11-24 MED ORDER — TALTZ 80 MG/ML ~~LOC~~ SOAJ: 80.0000 mg | SUBCUTANEOUS | 1 refills | Status: DC

## 2023-11-24 MED ORDER — TALTZ 80 MG/ML ~~LOC~~ SOAJ: 80.0000 mg | Freq: Once | SUBCUTANEOUS | 0 refills | Status: AC

## 2023-11-24 MED ORDER — TALTZ 80 MG/ML ~~LOC~~ SOAJ: 160.0000 mg | SUBCUTANEOUS | 0 refills | Status: DC

## 2023-12-18 ENCOUNTER — Encounter: Payer: Self-pay | Admitting: Dermatology

## 2023-12-22 ENCOUNTER — Other Ambulatory Visit: Payer: Self-pay

## 2023-12-22 DIAGNOSIS — L4050 Arthropathic psoriasis, unspecified: Secondary | ICD-10-CM

## 2023-12-22 DIAGNOSIS — L409 Psoriasis, unspecified: Secondary | ICD-10-CM

## 2023-12-22 MED ORDER — TALTZ 80 MG/ML ~~LOC~~ SOAJ: 80.0000 mg | SUBCUTANEOUS | 1 refills | Status: AC

## 2023-12-22 MED ORDER — TALTZ 80 MG/ML ~~LOC~~ SOAJ: 160.0000 mg | SUBCUTANEOUS | 1 refills | Status: AC

## 2023-12-22 MED ORDER — TALTZ 80 MG/ML ~~LOC~~ SOAJ: 80.0000 mg | SUBCUTANEOUS | 11 refills | Status: DC

## 2023-12-22 MED ORDER — TALTZ 80 MG/ML ~~LOC~~ SOAJ: 80.0000 mg | SUBCUTANEOUS | 1 refills | Status: DC

## 2023-12-22 MED ORDER — TALTZ 80 MG/ML ~~LOC~~ SOAJ: 80.0000 mg | SUBCUTANEOUS | 11 refills | Status: AC

## 2023-12-22 MED ORDER — TALTZ 80 MG/ML ~~LOC~~ SOAJ: 160.0000 mg | SUBCUTANEOUS | 1 refills | Status: DC

## 2023-12-25 ENCOUNTER — Telehealth: Payer: Self-pay

## 2023-12-25 NOTE — Telephone Encounter (Signed)
 I spoke with Ardy T. From [facility] and attempted to complete a conference call with Mr. Alcock while on the line with Ardy. Ardy confirmed they did receive the prescription and is going to expedite the delivery for Mr. Hannig's Taltz . I LVM for Mr. Biggar stating I was attempting to complete a conference call with [facility] so that he and his significant other will know the prescription is being expedited and they will receive a call to schedule the delivery. I also explained that they no longer will need to contact [facility] because they are no longer handling his prescription due his insurance mandating he fill with [facility].   Ardy provided the Task ID #: [id number]

## 2024-01-06 ENCOUNTER — Encounter: Payer: Self-pay | Admitting: Dermatology

## 2024-01-06 ENCOUNTER — Ambulatory Visit (INDEPENDENT_AMBULATORY_CARE_PROVIDER_SITE_OTHER): Admitting: Dermatology

## 2024-01-06 VITALS — BP 101/64

## 2024-01-06 DIAGNOSIS — L409 Psoriasis, unspecified: Secondary | ICD-10-CM

## 2024-01-06 DIAGNOSIS — L4050 Arthropathic psoriasis, unspecified: Secondary | ICD-10-CM

## 2024-01-06 MED ORDER — IXEKIZUMAB 80 MG/ML ~~LOC~~ SOAJ
160.0000 mg | Freq: Once | SUBCUTANEOUS | Status: AC
  Administered 2024-01-06: 160 mg via SUBCUTANEOUS

## 2024-01-06 MED ORDER — CALCIPOTRIENE 0.005 % EX OINT: TOPICAL_OINTMENT | Freq: Two times a day (BID) | CUTANEOUS | 3 refills | Status: DC

## 2024-01-06 MED ORDER — CLOBETASOL PROPIONATE 0.05 % EX CREA: TOPICAL_CREAM | CUTANEOUS | 2 refills | Status: DC

## 2024-01-06 NOTE — Patient Instructions (Signed)

## 2024-01-06 NOTE — Progress Notes (Signed)
   Follow-Up Visit   Subjective  Jeffery Sanders is a 78 y.o. male who presents for the following: Psoriasis - Taltz  injection training   The following portions of the chart were reviewed this encounter and updated as appropriate: medications, allergies, medical history  Review of Systems:  No other skin or systemic complaints except as noted in HPI or Assessment and Plan.  Objective  Well appearing patient in no apparent distress; mood and affect are within normal limits.   A focused examination was performed of the following areas: arms, legs   Relevant exam findings are noted in the Assessment and Plan.    Assessment & Plan   PSORIASIS and Psoriatic Arthritis Exam: Well-demarcated erythematous papules/plaques with silvery scale, guttate pink scaly papules of scalp, hands, arms. 12% BSA IGA 4.   - Assessment: Patient presents with severe psoriasis affecting palms, elbows, knees, back, and scalp. The condition is characterized by significant erythema and scales, with an IgA score of 4 and body surface area involvement of 10-15%. Additionally, the patient exhibits dactylitis and joint pain, indicative of psoriatic arthritis. Given the extent and severity of symptoms, initiation of biologic therapy with Taltz  (ixekizumab ) is warranted.  - Plan:    Initiate Taltz  (ixekizumab ) therapy:     - Loading dose: 160 mg subcutaneously today     - Maintenance: 80 mg subcutaneously every 2 weeks for the next 12 weeks     - Starting at week 16: 80 mg subcutaneously every 4 weeks    Continue topical therapy:     - Clobetasol : Apply twice daily for up to 3 weeks     - Alternate with calcipotriene : Apply for 2 weeks after clobetasol  course    Patient education:     - Wear masks in crowded areas and on airplanes to reduce infection risk     - If sick and due for an injection, wait until feeling better, then restart injection and reset timeline for next dose      - Respond to [facility] messages to ensure timely shipment of auto-injector pens   Patient and his significant other trained today with the Taltz  Auto injector trainer. Advised that todays dose will be 2 pens then he will take another injection in 2 weeks and one every 2 weeks thereafter for 3 months. After the 3 month loading dose, he will take an injection every 4 weeks.  Patient's significant other injected Taltz  to left upper arm and left thigh today. Patient tolerated well with no reactions. Lot #[id number] AA Exp 10/11/2024   No follow-ups on file.  I, Roseline Hutchinson, CMA, am acting as scribe for [employer], DO .   Documentation: I have reviewed the above documentation for accuracy and completeness, and I agree with the above.  Delon Lenis, DO

## 2024-01-14 ENCOUNTER — Encounter: Payer: Self-pay | Admitting: Internal Medicine

## 2024-01-26 ENCOUNTER — Ambulatory Visit: Admitting: Dermatology

## 2024-03-29 ENCOUNTER — Encounter: Payer: Self-pay | Admitting: Internal Medicine

## 2024-03-29 ENCOUNTER — Ambulatory Visit: Admitting: Internal Medicine

## 2024-03-29 VITALS — BP 104/66 | HR 66 | Ht 72.0 in | Wt 307.0 lb

## 2024-03-29 DIAGNOSIS — I10 Essential (primary) hypertension: Secondary | ICD-10-CM

## 2024-03-29 DIAGNOSIS — M533 Sacrococcygeal disorders, not elsewhere classified: Secondary | ICD-10-CM

## 2024-03-29 DIAGNOSIS — I480 Paroxysmal atrial fibrillation: Secondary | ICD-10-CM

## 2024-03-29 DIAGNOSIS — R7303 Prediabetes: Secondary | ICD-10-CM | POA: Diagnosis not present

## 2024-03-29 DIAGNOSIS — L409 Psoriasis, unspecified: Secondary | ICD-10-CM

## 2024-03-29 DIAGNOSIS — E782 Mixed hyperlipidemia: Secondary | ICD-10-CM | POA: Diagnosis not present

## 2024-03-29 DIAGNOSIS — Z6841 Body Mass Index (BMI) 40.0 and over, adult: Secondary | ICD-10-CM

## 2024-03-29 NOTE — Progress Notes (Unsigned)
 Established Patient Office Visit  Subjective:  Patient ID: Jeffery Sanders, male    DOB: [birth year]  Age: 78 y.o. MRN: [id number]  CC:  Chief Complaint  Patient presents with   Hypertension    6 month f/u    Fall    Reports a fall 2 weeks ago , fell on his tailbone.     HPI Jeffery Sanders is a 78 y.o. male with past medical history of HTN, paroxysmal A Fib and HLD who presents for f/u of his chronic medical conditions.  HTN: He takes Amlodipine and Triamterene-HCTZ for HTN. BP is well-controlled. Takes medications regularly. Patient denies headache, dizziness, chest pain, dyspnea or palpitations.  He has a h/o paroxysmal A Fib, and takes Sotalol and Eliquis. He follows up with Cardiologist at [facility]. No h/o cardiac ablation.  He saw Dr. Alm for rash on his hands and feet.  He has erythema and skin peeling of the bilateral palms. He has had better relief with Clobetasol  cream.  He is planned to start Skyrizi for it.  He has chronic nodes on small joints of hands and has stiffness of the joints, but denies any severe pain.  He has deviated distal ends of fingers, which is chronic.  Past Medical History:  Diagnosis Date   Clotting disorder    Phreesia 04/18/2020   Diverticulosis of colon 09/21/2020   Hypertension    Phreesia 04/18/2020    Past Surgical History:  Procedure Laterality Date   EYE SURGERY N/A    Phreesia 04/18/2020   FRACTURE SURGERY N/A    Phreesia 04/18/2020   HERNIA REPAIR N/A    Phreesia 04/18/2020   SPINE SURGERY N/A    Phreesia 04/18/2020    History reviewed. No pertinent family history.  Social History   Socioeconomic History   Marital status: Married    Spouse name: Not on file   Number of children: Not on file   Years of education: Not on file   Highest education level: Not on file  Occupational History   Not on file  Tobacco Use   Smoking status: Never   Smokeless tobacco: Never  Vaping Use   Vaping status: Never Used  Substance and Sexual  Activity   Alcohol use: Never   Drug use: Never   Sexual activity: Yes  Other Topics Concern   Not on file  Social History Narrative   Not on file   Social Drivers of Health   Financial Resource Strain: Low Risk  (08/27/2023)   Overall Financial Resource Strain (CARDIA)    Difficulty of Paying Living Expenses: Not hard at all  Food Insecurity: No Food Insecurity (08/27/2023)   Hunger Vital Sign    Worried About Running Out of Food in the Last Year: Never true    Ran Out of Food in the Last Year: Never true  Transportation Needs: No Transportation Needs (08/27/2023)   PRAPARE - Administrator, Civil Service (Medical): No    Lack of Transportation (Non-Medical): No  Physical Activity: Sufficiently Active (08/27/2023)   Exercise Vital Sign    Days of Exercise per Week: 7 days    Minutes of Exercise per Session: 30 min  Stress: No Stress Concern Present (08/27/2023)   [employer] of Occupational Health - Occupational Stress Questionnaire    Feeling of Stress : Not at all  Social Connections: Moderately Isolated (08/27/2023)   Social Connection and Isolation Panel    Frequency of Communication with Friends and Family: More than  three times a week    Frequency of Social Gatherings with Friends and Family: More than three times a week    Attends Religious Services: Never    Database Administrator or Organizations: No    Attends Banker Meetings: Never    Marital Status: Married  Catering Manager Violence: Not At Risk (08/27/2023)   Humiliation, Afraid, Rape, and Kick questionnaire    Fear of Current or Ex-Partner: No    Emotionally Abused: No    Physically Abused: No    Sexually Abused: No    Outpatient Medications Prior to Visit  Medication Sig Dispense Refill   amLODipine (NORVASC) 5 MG tablet Take 1 tablet by mouth daily.     apixaban (ELIQUIS) 5 MG TABS tablet Take 1 tablet by mouth every 12 (twelve) hours.     augmented betamethasone dipropionate  (DIPROLENE-AF) 0.05 % cream Apply topically.     calcipotriene  (DOVONOX) 0.005 % ointment Apply topically 2 (two) times daily. 60 g 3   clobetasol  cream (TEMOVATE ) 0.05 % Apply topically as directed. 60 g 2   Coenzyme Q10 100 MG capsule Take by mouth.     ixekizumab  (TALTZ ) 80 MG/ML pen Inject 2 mLs (160 mg total) into the skin as directed. Inject 160 mg (2 x 80 mg) subcutaneous at week 0, then begin first induction dose 80 mg (1 x 80 mg) 2 weeks later (week 2). 3 mL 1   ixekizumab  (TALTZ ) 80 MG/ML pen Inject 1 mL (80 mg total) into the skin every 14 (fourteen) days. Weeks 4-10. 2 mL 1   ixekizumab  (TALTZ ) 80 MG/ML pen Inject 1 mL (80 mg total) into the skin every 28 (twenty-eight) days. For maintenance. 1 mL 11   magnesium oxide (MAG-OX) 400 MG tablet Take by mouth.     Multiple Vitamins-Minerals (PRESERVISION AREDS 2 PO) Take 2 capsules by mouth daily.     Propylene Glycol (SYSTANE COMPLETE OP) Apply 4 drops to eye daily.     simvastatin (ZOCOR) 20 MG tablet Take by mouth.     sotalol (BETAPACE) 120 MG tablet Take by mouth.     triamterene-hydrochlorothiazide (DYAZIDE) 37.5-25 MG capsule Take 1 capsule by mouth every morning.     No facility-administered medications prior to visit.    Allergies  Allergen Reactions   No Known Allergies Hives   Tuberculin, Ppd Hives    ROS Review of Systems  Constitutional:  Negative for chills and fever.  HENT:  Negative for congestion, postnasal drip and sore throat.   Eyes:  Negative for pain and discharge.  Respiratory:  Negative for cough and shortness of breath.   Cardiovascular:  Negative for chest pain, palpitations and leg swelling.  Gastrointestinal:  Negative for abdominal pain, constipation, diarrhea and vomiting.  Genitourinary:  Negative for dysuria and hematuria.  Musculoskeletal:  Positive for arthralgias (L knee). Negative for neck pain and neck stiffness.       R ankle, foot pain  Skin:  Positive for rash.  Neurological:  Negative  for dizziness and weakness.  Psychiatric/Behavioral:  Negative for agitation and behavioral problems.       Objective:    Physical Exam Vitals reviewed.  Constitutional:      General: He is not in acute distress.    Appearance: He is obese. He is not diaphoretic.  HENT:     Head: Normocephalic and atraumatic.     Nose: Nose normal.     Mouth/Throat:     Mouth: Mucous membranes  are moist.  Eyes:     General: No scleral icterus.    Extraocular Movements: Extraocular movements intact.  Cardiovascular:     Rate and Rhythm: Normal rate and regular rhythm.     Heart sounds: Normal heart sounds. No murmur heard. Pulmonary:     Breath sounds: Normal breath sounds. No wheezing or rales.  Musculoskeletal:     Cervical back: Neck supple. No tenderness.     Right lower leg: No edema.     Left lower leg: No edema.     Comments: Heberden and Bouchard nodes noted bilaterally with ulnar deviation  Skin:    General: Skin is warm.     Findings: Rash (Erythematous patches over bilateral palms, skin pilling) present.     Comments: Scaly patches and erythema over b/l feet  Neurological:     General: No focal deficit present.     Mental Status: He is alert and oriented to person, place, and time.     Sensory: No sensory deficit.     Motor: No weakness.  Psychiatric:        Mood and Affect: Mood normal.        Behavior: Behavior normal.     BP 104/66   Pulse 66   Ht 6' (1.829 m)   Wt (!) 307 lb (139.3 kg)   SpO2 96%   BMI 41.64 kg/m  Wt Readings from Last 3 Encounters:  03/29/24 (!) 307 lb (139.3 kg)  09/23/23 (!) 301 lb 9.6 oz (136.8 kg)  08/27/23 290 lb (131.5 kg)    Lab Results  Component Value Date   TSH 4.120 09/24/2023   Lab Results  Component Value Date   WBC 6.6 09/24/2023   HGB 14.0 09/24/2023   HCT 43.5 09/24/2023   MCV 89 09/24/2023   PLT 199 09/24/2023   Lab Results  Component Value Date   NA 141 09/24/2023   K 4.3 09/24/2023   CO2 24 09/24/2023    GLUCOSE 106 (H) 09/24/2023   BUN 16 09/24/2023   CREATININE 0.84 09/24/2023   BILITOT 0.4 09/24/2023   ALKPHOS 68 09/24/2023   AST 20 09/24/2023   ALT 9 09/24/2023   PROT 6.7 09/24/2023   ALBUMIN 4.2 09/24/2023   CALCIUM 8.9 09/24/2023   EGFR 90 09/24/2023   Lab Results  Component Value Date   CHOL 127 09/24/2023   Lab Results  Component Value Date   HDL 35 (L) 09/24/2023   Lab Results  Component Value Date   LDLCALC 73 09/24/2023   Lab Results  Component Value Date   TRIG 98 09/24/2023   Lab Results  Component Value Date   CHOLHDL 3.6 09/24/2023   Lab Results  Component Value Date   HGBA1C 6.0 (H) 09/24/2023      Assessment & Plan:   Problem List Items Addressed This Visit   None     No orders of the defined types were placed in this encounter.   Follow-up: No follow-ups on file.    Suzzane MARLA Blanch, MD

## 2024-03-29 NOTE — Patient Instructions (Signed)
 Please continue to take medications as prescribed.  Please continue to follow low carb diet and perform moderate exercise/walking at least 150 mins/week.  Please consider getting Shingrix and Tdap vaccine at local pharmacy.

## 2024-03-30 ENCOUNTER — Ambulatory Visit: Payer: Self-pay | Admitting: Internal Medicine

## 2024-03-30 LAB — CMP14+EGFR
ALT: 9 IU/L (ref 0–44)
AST: 20 IU/L (ref 0–40)
Albumin: 4.2 g/dL (ref 3.8–4.8)
Alkaline Phosphatase: 68 IU/L (ref 47–123)
BUN/Creatinine Ratio: 20 (ref 10–24)
BUN: 19 mg/dL (ref 8–27)
Bilirubin Total: 0.5 mg/dL (ref 0.0–1.2)
CO2: 26 mmol/L (ref 20–29)
Calcium: 9.3 mg/dL (ref 8.6–10.2)
Chloride: 102 mmol/L (ref 96–106)
Creatinine, Ser: 0.93 mg/dL (ref 0.76–1.27)
Globulin, Total: 2.6 g/dL (ref 1.5–4.5)
Glucose: 106 mg/dL — ABNORMAL HIGH (ref 70–99)
Potassium: 4.5 mmol/L (ref 3.5–5.2)
Sodium: 142 mmol/L (ref 134–144)
Total Protein: 6.8 g/dL (ref 6.0–8.5)
eGFR: 85 mL/min/1.73 (ref >59)

## 2024-03-30 LAB — CBC WITH DIFFERENTIAL/PLATELET
Basophils Absolute: 0.1 x10E3/uL (ref 0.0–0.2)
Basos: 1 %
EOS (ABSOLUTE): 0.2 x10E3/uL (ref 0.0–0.4)
Eos: 3 %
Hematocrit: 46.4 % (ref 37.5–51.0)
Hemoglobin: 15 g/dL (ref 13.0–17.7)
Immature Grans (Abs): 0 x10E3/uL (ref 0.0–0.1)
Immature Granulocytes: 0 %
Lymphocytes Absolute: 2.2 x10E3/uL (ref 0.7–3.1)
Lymphs: 35 %
MCH: 28.5 pg (ref 26.6–33.0)
MCHC: 32.3 g/dL (ref 31.5–35.7)
MCV: 88 fL (ref 79–97)
Monocytes Absolute: 1 x10E3/uL — ABNORMAL HIGH (ref 0.1–0.9)
Monocytes: 16 %
Neutrophils Absolute: 2.9 x10E3/uL (ref 1.4–7.0)
Neutrophils: 44 %
Platelets: 205 x10E3/uL (ref 150–450)
RBC: 5.26 x10E6/uL (ref 4.14–5.80)
RDW: 14 % (ref 11.6–15.4)
WBC: 6.4 x10E3/uL (ref 3.4–10.8)

## 2024-03-30 LAB — HEMOGLOBIN A1C
Est. average glucose Bld gHb Est-mCnc: 128 mg/dL
Hgb A1c MFr Bld: 6.1 % — ABNORMAL HIGH (ref 4.8–5.6)

## 2024-03-31 DIAGNOSIS — M533 Sacrococcygeal disorders, not elsewhere classified: Secondary | ICD-10-CM | POA: Insufficient documentation

## 2024-03-31 NOTE — Assessment & Plan Note (Signed)
 On Simvastatin Checked lipid profile

## 2024-03-31 NOTE — Assessment & Plan Note (Signed)
 Since 03/15/24, due to a mechanical fall Had severe sacral area pain, which has improved now Due to improving pain without any warning signs, would avoid imaging for now Tylenol as needed for pain

## 2024-03-31 NOTE — Assessment & Plan Note (Signed)
 BMI Readings from Last 3 Encounters:  03/29/24 41.64 kg/m  09/23/23 40.90 kg/m  08/27/23 39.33 kg/m   With HTN, prediabetes and HLD Diet modification and moderate exercise advised

## 2024-03-31 NOTE — Assessment & Plan Note (Signed)
Rate-controlled On Sotalol and Eliquis Follows up with Cardiologist 

## 2024-03-31 NOTE — Assessment & Plan Note (Signed)
 Lab Results  Component Value Date   HGBA1C 6.1 (H) 03/29/2024   Continue to follow low-carb diet

## 2024-03-31 NOTE — Assessment & Plan Note (Signed)
 BP Readings from Last 1 Encounters:  03/29/24 104/66   Well-controlled with Amlodipine and Dyazide Counseled for compliance with the medications Advised DASH diet and moderate exercise/walking, at least 150 mins/week

## 2024-03-31 NOTE — Assessment & Plan Note (Signed)
 Has hand erythema and skin peeling due to psoriasis Has calcipotriene  ointment Clobetasol  cream for rash/itching Followed by [facility] dermat[facility] - improved with Taltz  now

## 2024-05-19 ENCOUNTER — Encounter: Payer: Self-pay | Admitting: Dermatology

## 2024-05-19 ENCOUNTER — Ambulatory Visit: Admitting: Dermatology

## 2024-05-19 DIAGNOSIS — B369 Superficial mycosis, unspecified: Secondary | ICD-10-CM

## 2024-05-19 DIAGNOSIS — L409 Psoriasis, unspecified: Secondary | ICD-10-CM | POA: Diagnosis not present

## 2024-05-19 DIAGNOSIS — B353 Tinea pedis: Secondary | ICD-10-CM

## 2024-05-19 MED ORDER — CLOBETASOL PROPIONATE 0.05 % EX CREA: TOPICAL_CREAM | CUTANEOUS | 3 refills | Status: AC

## 2024-05-19 MED ORDER — CALCIPOTRIENE 0.005 % EX CREA: TOPICAL_CREAM | Freq: Two times a day (BID) | CUTANEOUS | 0 refills | Status: AC

## 2024-05-19 MED ORDER — CLOTRIMAZOLE-BETAMETHASONE 1-0.05 % EX CREA: 1.0000 | TOPICAL_CREAM | Freq: Two times a day (BID) | CUTANEOUS | 4 refills | Status: AC

## 2024-05-19 NOTE — Patient Instructions (Addendum)
 " VISIT SUMMARY:  During your visit, we discussed the progress of your psoriasis treatment and addressed your concerns about the recurrence of symptoms on your palms. We also evaluated and provided a treatment plan for your tinea pedis.  YOUR PLAN:  -PSORIASIS:  Psoriasis is a chronic skin condition that causes skin cells to build up rapidly, leading to scaling and inflammation. You have experienced significant improvement with Taltz , but some symptoms have returned on your palms. We will continue with Taltz  as prescribed and switch your calcipotriene  to a cream formulation to reduce greasiness.   You should alternate using clobetasol  cream and calcipotriene  cream every two weeks until your skin clears. Use clobetasol  ointment at night and cream in the morning. Your clobetasol  cream prescription has been refilled.  -TINEA PEDIS:  Tinea pedis, also known as athlete's foot, is a fungal infection that causes peeling and flaking of the skin on your feet. We have prescribed clotrimazole -betamethasone  cream for this condition.   Apply a thin layer of the cream twice daily for four weeks, then take a two-week break. If symptoms persist after the break, resume treatment. Additionally, treat your shoes with antifungal powder or spray twice a week and continue using Cerave psoriasis moisturizer to help with skin peeling.  INSTRUCTIONS:  Follow the treatment plans for psoriasis and tinea pedis as discussed. If symptoms persist or worsen, please schedule a follow-up appointment.       Important Information  Due to recent changes in healthcare laws, you may see results of your pathology and/or laboratory studies on MyChart before the doctors have had a chance to review them. We understand that in some cases there may be results that are confusing or concerning to you. Please understand that not all results are received at the same time and often the doctors may need to interpret multiple results in  order to provide you with the best plan of care or course of treatment. Therefore, we ask that you please give us  2 business days to thoroughly review all your results before contacting the office for clarification. Should we see a critical lab result, you will be contacted sooner.   If You Need Anything After Your Visit  If you have any questions or concerns for your doctor, please call our main line at [phone] If no one answers, please leave a voicemail as directed and we will return your call as soon as possible. Messages left after 4 pm will be answered the following business day.   You may also send us  a message via MyChart. We typically respond to MyChart messages within 1-2 business days.  For prescription refills, please ask your pharmacy to contact our office. Our fax number is [phone].  If you have an urgent issue when the clinic is closed that cannot wait until the next business day, you can page your doctor at the number below.    Please note that while we do our best to be available for urgent issues outside of office hours, we are not available 24/7.   If you have an urgent issue and are unable to reach us , you may choose to seek medical care at your doctor's office, retail clinic, urgent care center, or emergency room.  If you have a medical emergency, please immediately call 911 or go to the emergency department. In the event of inclement weather, please call our main line at [phone] for an update on the status of any delays or closures.  Dermatology Medication Tips: Please keep  the boxes that topical medications come in in order to help keep track of the instructions about where and how to use these. Pharmacies typically print the medication instructions only on the boxes and not directly on the medication tubes.   If your medication is too expensive, please contact our office at [phone] or send us  a message through MyChart.   We are unable to tell what  your co-pay for medications will be in advance as this is different depending on your insurance coverage. However, we may be able to find a substitute medication at lower cost or fill out paperwork to get insurance to cover a needed medication.   If a prior authorization is required to get your medication covered by your insurance company, please allow us  1-2 business days to complete this process.  Drug prices often vary depending on where the prescription is filled and some pharmacies may offer cheaper prices.  The website www.goodrx.com contains coupons for medications through different pharmacies. The prices here do not account for what the cost may be with help from insurance (it may be cheaper with your insurance), but the website can give you the price if you did not use any insurance.  - You can print the associated coupon and take it with your prescription to the pharmacy.  - You may also stop by our office during regular business hours and pick up a GoodRx coupon card.  - If you need your prescription sent electronically to a different pharmacy, notify our office through [facility] or by phone at [phone]     "

## 2024-05-19 NOTE — Progress Notes (Signed)
 "  Follow-Up Visit   Patient (and/or pt guardian) consented to the use of AI-assisted tools for note generation.  Subjective  Jeffery Sanders is a 79 y.o. male who presents for the following: Psoriasis  Patient was last evaluated on 01/06/24.  At this visit Taltz  was initiated.  Patient was advised to inject medication every 2 weeks for the next 12 weeks Advised to continue with clobetasol  and alternate with calcipotriene  as needed in two week intervals Patient reports sxs are better. Patient reports that symptoms have greatly improved since starting Taltz  and that some areas are no longer there. Patient rates irritation 1 out of 10  Patient reports that areas on palm of hands cleared while using Taltz  for two weeks but once switching to once a month injections, some flakiness has come back Patient reports he is using clobetasol  twice daily on palms and currently does not take breaks after using for 14 days.  Patient states he rarely uses the Calcipotriene  due to it being an ointment and he does not like the feel of it  Patient would like to discuss spots on top of hands and would like to discuss psoriasis on feet. He reports he has not tried using the prescribed topicals on his feet Patient denies medication changes.  The following portions of the chart were reviewed this encounter and updated as appropriate: medications, allergies, medical history  Review of Systems:  No other skin or systemic complaints except as noted in HPI or Assessment and Plan.  Objective  Well appearing patient in no apparent distress; mood and affect are within normal limits.  A focused examination was performed of the following areas: Bilateral hands, Neck, Bilateral feet  Relevant exam findings are noted in the Assessment and Plan.            Assessment & Plan    Psoriasis Significant improvement in psoriasis with Taltz , especially on the hands. Some recurrence on the palms after switching to monthly  dosing. Dislikes greasiness of calcipotriene  ointment, affecting hand use. - Continue Taltz  as prescribed. - Switched calcipotriene  to a cream formulation. - Alternate clobetasol  cream and calcipotriene  cream every two weeks until clear. - Use clobetasol  ointment at night and cream in the morning. - Refilled clobetasol  cream prescription.  Tinea pedis Presence of tinea pedis with peeling and flaking of skin, likely contributing to symptoms. Differential includes callus or psoriasis, but fungal infection confirmed. - Prescribed clotrimazole  betamethasone  cream for tinea pedis. - Apply a thin layer of clotrimazole  betamethasone  cream twice daily for four weeks, then take a two-week break. - If symptoms persist after the break, resume treatment. - Treat shoes with antifungal powder or spray twice a week. - Use Cerave psoriasis moisturizer to aid peeling.   PSORIASIS   This Visit - calcipotriene  (DOVONOX) 0.005 % cream - Apply topically 2 (two) times daily. - clobetasol  cream (TEMOVATE ) 0.05 % - Apply topically as directed. Existing Treatments - ixekizumab  (TALTZ ) 80 MG/ML pen - Inject 2 mLs (160 mg total) into the skin as directed. Inject 160 mg (2 x 80 mg) subcutaneous at week 0, then begin first induction dose 80 mg (1 x 80 mg) 2 weeks later (week 2). - ixekizumab  (TALTZ ) 80 MG/ML pen - Inject 1 mL (80 mg total) into the skin every 14 (fourteen) days. Weeks 4-10. - ixekizumab  (TALTZ ) 80 MG/ML pen - Inject 1 mL (80 mg total) into the skin every 28 (twenty-eight) days. For maintenance. FUNGAL DERMATITIS   TINEA PEDIS OF BOTH FEET  This Visit - clotrimazole -betamethasone  (LOTRISONE ) cream - Apply 1 Application topically 2 (two) times daily. Apply topically daily for 4 weeks  Return in 9 months (on 02/16/2025) for Psoriasis f/u.  LILLETTE Lyle Cords, am acting as a neurosurgeon for [employer], DO .   Documentation: I have reviewed the above documentation for accuracy and completeness,  and I agree with the above.  Delon Lenis, DO   "

## 2024-08-30 ENCOUNTER — Ambulatory Visit

## 2024-09-27 ENCOUNTER — Ambulatory Visit: Admitting: Internal Medicine

## 2025-02-15 ENCOUNTER — Ambulatory Visit: Admitting: Dermatology
